# Patient Record
Sex: Female | Born: 1960 | Race: Black or African American | Hispanic: No | Marital: Married | Smoking: Never smoker
Health system: Southern US, Community
[De-identification: ages and names within clinical notes are randomized; demographics above are authoritative.]

## PROBLEM LIST (undated history)

## (undated) DIAGNOSIS — I517 Cardiomegaly: Secondary | ICD-10-CM

## (undated) DIAGNOSIS — I38 Endocarditis, valve unspecified: Secondary | ICD-10-CM

## (undated) DIAGNOSIS — C189 Malignant neoplasm of colon, unspecified: Secondary | ICD-10-CM

## (undated) DIAGNOSIS — B9681 Helicobacter pylori [H. pylori] as the cause of diseases classified elsewhere: Secondary | ICD-10-CM

## (undated) DIAGNOSIS — M3500 Sicca syndrome, unspecified: Secondary | ICD-10-CM

## (undated) DIAGNOSIS — D509 Iron deficiency anemia, unspecified: Secondary | ICD-10-CM

## (undated) DIAGNOSIS — O039 Complete or unspecified spontaneous abortion without complication: Secondary | ICD-10-CM

## (undated) DIAGNOSIS — K635 Polyp of colon: Secondary | ICD-10-CM

## (undated) DIAGNOSIS — K2970 Gastritis, unspecified, without bleeding: Secondary | ICD-10-CM

## (undated) HISTORY — DX: Gastritis, unspecified, without bleeding: K29.70

## (undated) HISTORY — PX: HEMICOLECTOMY: SHX854

## (undated) HISTORY — PX: DILATION AND CURETTAGE OF UTERUS: SHX78

## (undated) HISTORY — PX: BREAST REDUCTION SURGERY: SHX8

## (undated) HISTORY — DX: Polyp of colon: K63.5

## (undated) HISTORY — DX: Iron deficiency anemia, unspecified: D50.9

## (undated) HISTORY — DX: Helicobacter pylori (H. pylori) as the cause of diseases classified elsewhere: B96.81

---

## 1991-10-11 HISTORY — PX: REDUCTION MAMMAPLASTY: SUR839

## 1997-12-16 ENCOUNTER — Inpatient Hospital Stay (HOSPITAL_COMMUNITY): Admission: AD | Admit: 1997-12-16 | Discharge: 1997-12-16 | Payer: Self-pay | Admitting: *Deleted

## 1998-07-06 ENCOUNTER — Encounter: Payer: Self-pay | Admitting: Emergency Medicine

## 1998-07-06 ENCOUNTER — Emergency Department (HOSPITAL_COMMUNITY): Admission: EM | Admit: 1998-07-06 | Discharge: 1998-07-06 | Payer: Self-pay | Admitting: Emergency Medicine

## 1999-02-08 ENCOUNTER — Other Ambulatory Visit: Admission: RE | Admit: 1999-02-08 | Discharge: 1999-02-08 | Payer: Self-pay | Admitting: *Deleted

## 1999-05-09 ENCOUNTER — Emergency Department (HOSPITAL_COMMUNITY): Admission: EM | Admit: 1999-05-09 | Discharge: 1999-05-09 | Payer: Self-pay | Admitting: Emergency Medicine

## 1999-05-19 ENCOUNTER — Encounter: Admission: RE | Admit: 1999-05-19 | Discharge: 1999-08-17 | Payer: Self-pay | Admitting: *Deleted

## 2000-02-29 ENCOUNTER — Other Ambulatory Visit: Admission: RE | Admit: 2000-02-29 | Discharge: 2000-02-29 | Payer: Self-pay | Admitting: *Deleted

## 2004-05-02 ENCOUNTER — Inpatient Hospital Stay (HOSPITAL_COMMUNITY): Admission: EM | Admit: 2004-05-02 | Discharge: 2004-05-05 | Payer: Self-pay | Admitting: Emergency Medicine

## 2004-05-05 ENCOUNTER — Encounter: Payer: Self-pay | Admitting: Cardiology

## 2004-05-19 ENCOUNTER — Encounter: Admission: RE | Admit: 2004-05-19 | Discharge: 2004-05-19 | Payer: Self-pay | Admitting: Internal Medicine

## 2007-07-21 ENCOUNTER — Emergency Department (HOSPITAL_COMMUNITY): Admission: EM | Admit: 2007-07-21 | Discharge: 2007-07-21 | Payer: Self-pay | Admitting: Emergency Medicine

## 2008-12-23 DIAGNOSIS — B9681 Helicobacter pylori [H. pylori] as the cause of diseases classified elsewhere: Secondary | ICD-10-CM

## 2008-12-23 DIAGNOSIS — K635 Polyp of colon: Secondary | ICD-10-CM

## 2008-12-23 HISTORY — DX: Helicobacter pylori (H. pylori) as the cause of diseases classified elsewhere: B96.81

## 2008-12-23 HISTORY — DX: Polyp of colon: K63.5

## 2010-09-29 ENCOUNTER — Ambulatory Visit: Payer: Self-pay | Admitting: Oncology

## 2010-10-26 ENCOUNTER — Ambulatory Visit (HOSPITAL_COMMUNITY)
Admission: RE | Admit: 2010-10-26 | Discharge: 2010-10-26 | Payer: Self-pay | Source: Home / Self Care | Attending: Oncology | Admitting: Oncology

## 2010-10-26 LAB — CBC WITH DIFFERENTIAL/PLATELET
BASO%: 0.5 % (ref 0.0–2.0)
Basophils Absolute: 0 10*3/uL (ref 0.0–0.1)
EOS%: 1.5 % (ref 0.0–7.0)
Eosinophils Absolute: 0.1 10*3/uL (ref 0.0–0.5)
HCT: 36.1 % (ref 34.8–46.6)
HGB: 11.5 g/dL — ABNORMAL LOW (ref 11.6–15.9)
LYMPH%: 32.3 % (ref 14.0–49.7)
MCH: 23.3 pg — ABNORMAL LOW (ref 25.1–34.0)
MCHC: 32 g/dL (ref 31.5–36.0)
MCV: 72.8 fL — ABNORMAL LOW (ref 79.5–101.0)
MONO#: 0.4 10*3/uL (ref 0.1–0.9)
MONO%: 8.1 % (ref 0.0–14.0)
NEUT#: 2.5 10*3/uL (ref 1.5–6.5)
NEUT%: 57.6 % (ref 38.4–76.8)
Platelets: 279 10*3/uL (ref 145–400)
RBC: 4.96 10*6/uL (ref 3.70–5.45)
RDW: 18.9 % — ABNORMAL HIGH (ref 11.2–14.5)
WBC: 4.4 10*3/uL (ref 3.9–10.3)
lymph#: 1.4 10*3/uL (ref 0.9–3.3)

## 2010-10-26 LAB — LACTATE DEHYDROGENASE: LDH: 175 U/L (ref 94–250)

## 2010-10-26 LAB — COMPREHENSIVE METABOLIC PANEL
ALT: 19 U/L (ref 0–35)
AST: 21 U/L (ref 0–37)
Albumin: 3.7 g/dL (ref 3.5–5.2)
Alkaline Phosphatase: 82 U/L (ref 39–117)
BUN: 13 mg/dL (ref 6–23)
CO2: 29 mEq/L (ref 19–32)
Calcium: 9.2 mg/dL (ref 8.4–10.5)
Chloride: 106 mEq/L (ref 96–112)
Creatinine, Ser: 0.98 mg/dL (ref 0.40–1.20)
Glucose, Bld: 103 mg/dL — ABNORMAL HIGH (ref 70–99)
Potassium: 3.2 mEq/L — ABNORMAL LOW (ref 3.5–5.3)
Sodium: 142 mEq/L (ref 135–145)
Total Bilirubin: 0.4 mg/dL (ref 0.3–1.2)
Total Protein: 7.2 g/dL (ref 6.0–8.3)

## 2010-10-26 LAB — CEA: CEA: 1.3 ng/mL (ref 0.0–5.0)

## 2010-10-28 LAB — IRON AND TIBC
%SAT: 15 % — ABNORMAL LOW (ref 20–55)
Iron: 53 ug/dL (ref 42–145)
TIBC: 359 ug/dL (ref 250–470)
UIBC: 306 ug/dL

## 2010-10-28 LAB — TRANSFERRIN RECEPTOR, SOLUABLE: Transferrin Receptor, Soluble: 25.6 nmol/L

## 2010-10-28 LAB — FERRITIN: Ferritin: 21 ng/mL (ref 10–291)

## 2010-11-30 ENCOUNTER — Emergency Department (HOSPITAL_COMMUNITY): Payer: Medicare Other

## 2010-11-30 ENCOUNTER — Emergency Department (HOSPITAL_COMMUNITY)
Admission: EM | Admit: 2010-11-30 | Discharge: 2010-11-30 | Disposition: A | Discharge status: Home / Self Care | Payer: Medicare Other | Attending: Emergency Medicine | Admitting: Emergency Medicine

## 2010-11-30 DIAGNOSIS — M79609 Pain in unspecified limb: Secondary | ICD-10-CM

## 2010-11-30 DIAGNOSIS — I10 Essential (primary) hypertension: Secondary | ICD-10-CM | POA: Insufficient documentation

## 2010-11-30 DIAGNOSIS — M3500 Sicca syndrome, unspecified: Secondary | ICD-10-CM | POA: Insufficient documentation

## 2010-11-30 DIAGNOSIS — M25569 Pain in unspecified knee: Secondary | ICD-10-CM | POA: Insufficient documentation

## 2011-02-25 NOTE — Discharge Summary (Signed)
NAMEJASSICA, [name]                           ACCOUNT NO.:  [id number]   MEDICAL RECORD NO.:  [id number]                   PATIENT TYPE:  INP   LOCATION:  3704                                 FACILITY:  [facility]   PHYSICIAN:  Madaline Guthrie, M.D.                 DATE OF BIRTH:  10-Apr-1961   DATE OF ADMISSION:  05/02/2004  DATE OF DISCHARGE:  05/05/2004                                 DISCHARGE SUMMARY   CHIEF COMPLAINT:  Chest pain.   CONTINUITY DOCTOR:  Dr. Woodroe Chen, phone number [phone].   CONSULTANTS:  None.   DISCHARGE DIAGNOSES:  1. Iron-deficiency anemia.  2. Hypertension.  3. Hypokalemia.   DISCHARGE MEDICATIONS:  1. Ferrous sulfate 220/44ml liquid 8 milliliters three times per day.  2. Hydrochlorothiazide 25 mg one pill each day.  3. Protonix 40 mg, one pill each day p.o.   The patient is instructed to call Dr. Melvyn Novas at [phone] to have a follow up  appointment in two weeks.  The patient will need to have lab work in order  to have her potassium checked in two weeks, and she has an appointment for  May 19, 2004, at 10 a.m. at the outpatient clinic at [facility]  for anemia and HTN.  The patient was also advised to see OB/GYN for workup  for possible menorrhagia.  The patient declined for medical staff to make  and appointment, and said that she would find an OB/GYN on her own   PROCEDURES PERFORMED:  On May 02, 2004, EKG, 78 beats per minute, normal  sinus rhythm, axis 1, PR interval of 0.12 seconds, QRS interval of 0.06  seconds, QTC of 0.49 seconds.  Poor R wave progression in aVF, V2 and V3.  No signs of a ST-elevation myocardial infarction, no hypertrophy, not bundle  branch block.  The EKG was repeated on May 04, 2004, which showed that the  long QTC had resolved.   On May 02, 2004, chest x-ray showed some cardiomegaly, particularly on the  left side of the chest, but no signs of aortic dissection or other acute  cardiopulmonary  process.   The patient had a 2-D echocardiogram on July 27 which showed overall left  ventricular systolic function was normal.  Mitral valve showed normal  leaflet excursion as well as normal function.  The aortic valve thickness  with mildly increased.  Otherwise, the valves appeared to be grossly normal.  Left ventricular ejection fraction was estimated at being between 55 and  65%.  There was no diagnostic evidence of left ventricular regional wall  motion abnormalities. Left ventricular diastolic function parameters were  normal.   CONSULTATIONS:  None.   BRIEF ADMITTING HISTORY AND PHYSICAL:  The patient is a 50 year old female  who presented to the ER with leg and chest pain.  The chest pain was most  consistent with non-cardiac or atypical chest pain.  The leg pain was  characterized as crampy and worsened with movement, although it was relieved  by massage.  The patient stated that she was on the 5th day of her menstrual  cycle, which she stated has been of normal length and intensity of flow, and  noted fatigue, pallor, and dyspnea that had begun the day of admission.  The  patient also reported craving ice.  The patient's admission labs included a  white blood cell count of 4.5, hemoglobin of 3.1, hematocrit of 10.9, and  platelet count of 501.  Venous blood gas with a pH of 7.35, a pCO2 of 44.6,  a pO2 of 24.6.  ANC of 2.0, mean corpuscular volume of 48.  Red target cells  were noted on the patient's red blood cell smear.  She had an RDW of 23.2.   PROBLEM LIST:  1. Iron-deficiency anemia, probably secondary to the patient's menstrual     cycles.  The patient was given three units of packed red blood cells in     the ER, and upon initial arrival on the floor, this was successful in     increasing her hemoglobin up to 6.0.  The patient then received two     additional units of packed red blood cells.  By her third day in the     hospital, her hemoglobin had come up to 8.1.   Upon discharge, her     hemoglobin was 8.9.  As it had been stable, this was reassuring that the     patient was no longer in acute danger due to her anemia.  The patient was     checked after her period ceased and found to be guaiac negative, and she     had no reports of bleeding, no signs of tamponade, no hematuria, and     given her history of recent menstrual cycle, it was deemed this was by     far the most likely cause of her anemia.  As far as her anemia workup was     concerned, she had a ferritin level of 1 which is extremely low.     Fortunately, also the patient's risk of bleeding was relatively low with     a prothrombin time of 14.8, INR of 1.2, and a PTT of 34.  After the     patient's blood transfusions had ceased and her hemoglobin had more or     less stabilized, she was started on a liquid iron preparation.  This was     done because she has a strong aversion to large pills, but she seemed to     do well with the liquid.  The patient was advised to follow up with     OB/GYN for probably rather heavy periods, and the patient was advised to     consume a variety of iron-enriched foods and provided with information     from the nutritional department.  2. Hypertension.  The patient had a long running history of hypertension     prior to admission, which she stated improved over the last couple of     years because she had lost 88 pounds since approximately two years ago.     However, the patient's blood pressures were consistently elevated with     systolics usually running between 150 and 170/180.  After the patient's     anemia had resolved somewhat and her hemoglobin had stabilized, she was     started  on hydrochlorothiazide 25 mg once a day in a liquid form which     she tolerated well.  She had been on hydrochlorothiazide on an outpatient     basis some years prior to admission, along with Accupril 40 mg.  Although    the patient's potassium dropped considerably after  she started     hydrochlorothiazide, it was felt that given her past record, it was worth     continuing.  She was advised to eat a potassium enriched diet after being     repleted with KCL.  Unfortunately, the patient's blood pressure did not     go to normally-acceptable limits after she started the     hydrochlorothiazide, and her blood pressure medication will need to be     further titrated on an outpatient basis.  3. Hypokalemia.  As noted before on her third day of admission, the     patient's potassium dropped from 3.6 to 3.0 probably secondary to her     starting hydrochlorothiazide.  The patient's potassium was repleted, and     her potassium level was checked prior to discharge, at which time it was     3.9.  Again, the patient was advised to eat a variety of potassium     enriched foods upon discharge, and this issue will need to be further     followed on an outpatient basis.   DISCHARGE LABORATORIES:  The patient had discharge sodium of 136, potassium  3.9, chloride 105, bicarbonate 27, glucose 99.  BUN 5, creatinine 1.0,  calcium 8.4.  White blood cell count 5.9, hemoglobin 8.9, hematocrit 27.3,  platelet count 354,000.  A guaiac-negative occult blood check after her  period resolved.  A TSH level of 1.080.  PT of 14.8, INR 1.2, PTT 34,  ferritin of 1.  Iron of 39, total iron-binding capacity 420, percent  saturation 9%.  She ruled out negative three times for cardiac enzymes.  Reticulocyte count of 0.1, bright red blood cells 3.17, absolute  reticulocytes 3.2.  Serum pregnancy test was negative.  Technologist noted  on the patient's pre-transfusion blood, marked leukocytosis, teardrop cells,  target cells, schistocytes, ___________ at that point.      Laurell Roof, M.D.                      Madaline Guthrie, M.D.    LC/MEDQ  D:  05/05/2004  T:  05/05/2004  Job:  [id number]

## 2011-04-17 ENCOUNTER — Emergency Department (HOSPITAL_COMMUNITY): Payer: Medicare Other

## 2011-04-17 ENCOUNTER — Emergency Department (HOSPITAL_COMMUNITY)
Admission: EM | Admit: 2011-04-17 | Discharge: 2011-04-17 | Disposition: A | Discharge status: Home / Self Care | Payer: Medicare Other | Attending: Emergency Medicine | Admitting: Emergency Medicine

## 2011-04-17 DIAGNOSIS — R071 Chest pain on breathing: Secondary | ICD-10-CM | POA: Insufficient documentation

## 2011-04-17 DIAGNOSIS — Z79899 Other long term (current) drug therapy: Secondary | ICD-10-CM | POA: Insufficient documentation

## 2011-04-17 DIAGNOSIS — M3500 Sicca syndrome, unspecified: Secondary | ICD-10-CM | POA: Insufficient documentation

## 2011-04-17 DIAGNOSIS — R0602 Shortness of breath: Secondary | ICD-10-CM | POA: Insufficient documentation

## 2011-04-17 LAB — CK TOTAL AND CKMB (NOT AT ARMC)
CK, MB: 2.6 ng/mL (ref 0.3–4.0)
CK, MB: 2.5 ng/mL (ref 0.3–4.0)
Relative Index: 0.7 (ref 0.0–2.5)
Total CK: 421 U/L — ABNORMAL HIGH (ref 7–177)
Total CK: 355 U/L — ABNORMAL HIGH (ref 7–177)

## 2011-04-17 LAB — CBC
HCT: 36.8 % (ref 36.0–46.0)
Hemoglobin: 12.5 g/dL (ref 12.0–15.0)
MCH: 25.1 pg — ABNORMAL LOW (ref 26.0–34.0)
MCHC: 34 g/dL (ref 30.0–36.0)
RBC: 4.98 MIL/uL (ref 3.87–5.11)

## 2011-04-17 LAB — DIFFERENTIAL
Lymphocytes Relative: 41 % (ref 12–46)
Lymphs Abs: 2.3 10*3/uL (ref 0.7–4.0)
Monocytes Absolute: 0.5 10*3/uL (ref 0.1–1.0)
Monocytes Relative: 9 % (ref 3–12)
Neutro Abs: 2.6 10*3/uL (ref 1.7–7.7)
Neutrophils Relative %: 48 % (ref 43–77)

## 2011-04-17 LAB — BASIC METABOLIC PANEL
CO2: 26 mEq/L (ref 19–32)
Calcium: 9.4 mg/dL (ref 8.4–10.5)
GFR calc non Af Amer: >60 mL/min (ref >60)
Glucose, Bld: 87 mg/dL (ref 70–99)
Potassium: 3.7 mEq/L (ref 3.5–5.1)
Sodium: 139 mEq/L (ref 135–145)

## 2011-04-17 LAB — TROPONIN I: Troponin I: <0.3 ng/mL (ref <0.30)

## 2011-08-23 ENCOUNTER — Emergency Department (HOSPITAL_COMMUNITY): Payer: Medicare Other

## 2011-08-23 ENCOUNTER — Emergency Department (HOSPITAL_COMMUNITY)
Admission: EM | Admit: 2011-08-23 | Discharge: 2011-08-23 | Disposition: A | Discharge status: Home / Self Care | Payer: Medicare Other | Attending: Emergency Medicine | Admitting: Emergency Medicine

## 2011-08-23 ENCOUNTER — Other Ambulatory Visit: Payer: Self-pay

## 2011-08-23 DIAGNOSIS — R0789 Other chest pain: Secondary | ICD-10-CM | POA: Insufficient documentation

## 2011-08-23 DIAGNOSIS — M25519 Pain in unspecified shoulder: Secondary | ICD-10-CM | POA: Insufficient documentation

## 2011-08-23 HISTORY — DX: Malignant neoplasm of colon, unspecified: C18.9

## 2011-08-23 HISTORY — DX: Sjogren syndrome, unspecified: M35.00

## 2011-08-23 LAB — TROPONIN I: Troponin I: <0.3 ng/mL (ref <0.30)

## 2011-08-23 LAB — CBC
MCH: 25.5 pg — ABNORMAL LOW (ref 26.0–34.0)
MCV: 75.1 fL — ABNORMAL LOW (ref 78.0–100.0)
Platelets: 288 10*3/uL (ref 150–400)
RBC: 5.38 MIL/uL — ABNORMAL HIGH (ref 3.87–5.11)
RDW: 16.6 % — ABNORMAL HIGH (ref 11.5–15.5)

## 2011-08-23 LAB — DIFFERENTIAL
Basophils Absolute: 0 10*3/uL (ref 0.0–0.1)
Basophils Relative: 0 % (ref 0–1)
Eosinophils Absolute: 0.1 10*3/uL (ref 0.0–0.7)
Eosinophils Relative: 1 % (ref 0–5)
Neutrophils Relative %: 42 % — ABNORMAL LOW (ref 43–77)

## 2011-08-23 MED ORDER — IOHEXOL 300 MG/ML  SOLN
100.0000 mL | Freq: Once | INTRAMUSCULAR | Status: AC | PRN
  Administered 2011-08-23: 100 mL via INTRAVENOUS

## 2011-08-23 MED ORDER — MORPHINE SULFATE 4 MG/ML IJ SOLN
4.0000 mg | Freq: Once | INTRAMUSCULAR | Status: AC
  Administered 2011-08-23: 4 mg via INTRAVENOUS
  Filled 2011-08-23: qty 1

## 2011-08-23 MED ORDER — OMEPRAZOLE 20 MG PO CPDR: DELAYED_RELEASE_CAPSULE | ORAL | Status: DC

## 2011-08-23 MED ORDER — MORPHINE SULFATE 4 MG/ML IJ SOLN
2.0000 mg | Freq: Once | INTRAMUSCULAR | Status: AC
  Administered 2011-08-23: 2 mg via INTRAVENOUS
  Filled 2011-08-23: qty 1

## 2011-08-23 MED ORDER — SODIUM CHLORIDE 0.9 % IV SOLN
Freq: Once | INTRAVENOUS | Status: AC
  Administered 2011-08-23: 13:00:00 via INTRAVENOUS

## 2011-08-23 MED ORDER — ASPIRIN 81 MG PO CHEW
324.0000 mg | CHEWABLE_TABLET | Freq: Once | ORAL | Status: AC
  Administered 2011-08-23: 324 mg via ORAL
  Filled 2011-08-23: qty 4

## 2011-08-23 NOTE — ED Notes (Signed)
The pts pain is returning she is reguesting pain med given.  She is also waiting to be scanned c-t.  Her pain is rt sided chest that increases with the movement of her rt arm.  She describes as a heaviness

## 2011-08-23 NOTE — ED Provider Notes (Signed)
Medical screening examination/treatment/procedure(s) were performed by non-physician practitioner and as supervising physician I was immediately available for consultation/collaboration  Harrold Donath R. Rubin Payor, MD 08/23/11 [phone]

## 2011-08-23 NOTE — ED Notes (Signed)
Pt back from c-t her pain is not as severe as it  Once was but she still has the pain

## 2011-08-23 NOTE — ED Notes (Signed)
The pt has gone to c-t 10 minutes ago

## 2011-08-23 NOTE — ED Provider Notes (Signed)
Medical screening examination/treatment/procedure(s) were performed by non-physician practitioner and as supervising physician I was immediately available for consultation/collaboration  Rye Decoste R. Alease Fait, MD 08/23/11 1852 

## 2011-08-23 NOTE — ED Notes (Signed)
Pt c/o constant CP w/ pain radiating down right arm. Also c/o SOB with exertion

## 2011-08-23 NOTE — ED Provider Notes (Signed)
Patient still having pain that has changed locations. Cardiac and PE workup negative. Feel we can send the patient home to follow up with Dr. Nehemiah Settle. Recommended Prilosec and close follow up.  Rodena Medin, PA 08/23/11 [phone]

## 2011-08-23 NOTE — ED Provider Notes (Signed)
HPI Patient presents to emergency room with complaint of chest pain. Reports of the chest pain started last night. Radiates to her right shoulder. Reports it is worse with inspiration.  Patient has a past medical history significant for colon cancer. Patient denies any coughing. Patient denies that the pain is worse with exertion. Denies any exertional dyspnea. Reports that the pain has been constant in nature since last night. Patient denies ever having had a stress test. Patient states that she had some chest pain about 5 years ago that was diagnosis costochondritis. Patient denies any fevers. Patient denies any nausea or vomiting or abdominal pain. Patient reports the pain radiates into her right shoulder. Pain is located substernally in nature.    Screening exam completed. Initial exam performed.   Demetrius Charity, PA 08/23/11 1226   PMH  Colon cancer   ROS No abdominal pain, exertional dyspnea, exertional chest pain. No nausea or vomiting   PE: VS reviewed.  Patient Vitals for the past 24 hrs:  BP Temp Temp src Pulse Resp SpO2  08/23/11 1435 113/72 mmHg 97 F (36.1 C) Oral 55  15  99 %  08/23/11 1041 145/83 mmHg 98 F (36.7 C) Oral 69  19  -   Normocephalic atraumatic. PEERL. RRR. CTA. No pedal edema. Tenderness to palpation to the right side of her chest. Normal ROM.  Abdomen soft/nontender.    ED course:  Patient seen and evaluated.  VSS reviewed. . Nursing notes reviewed. Discussed with attending physician, advised CT angio to r/o PE due to patients PMH signficant for colon cancer. Cannot PERC patient because she is over 50. Lab results are WNL. EKG reviewed with Dr. Rubin Payor. Initial testing ordered. Will monitor the patient closely. They agree with the treatment plan and diagnosis.    Patient seen and re-evaluated. Resting comfortably. VSS stable. NAD. Patient notified of testing results. Stated agreement and understanding. Patient stated understanding to treatment plan  and diagnosis.    Date: 08/23/2011, 10:36 am  Rate: 81  Rhythm: normal sinus rhythm  QRS Axis: normal  Intervals: normal  ST/T Wave abnormalities: nonspecific T wave changes and in anterior leads  Conduction Disutrbances:none  Narrative Interpretation:   Old EKG Reviewed: unchanged    3:25 PM Patient care taken over by Shawn Stall, PA-C at 15:25 pm.    MDM: Chest pain   Demetrius Charity, PA 08/23/11 1526  Demetrius Charity, PA 08/23/11 1546

## 2011-11-02 ENCOUNTER — Telehealth: Payer: Self-pay | Admitting: Oncology

## 2011-11-02 ENCOUNTER — Other Ambulatory Visit: Payer: Self-pay | Admitting: Rheumatology

## 2011-11-02 DIAGNOSIS — Z1231 Encounter for screening mammogram for malignant neoplasm of breast: Secondary | ICD-10-CM

## 2011-11-02 NOTE — Telephone Encounter (Signed)
pt had called and needed to make her 2013 appt,per mosaiq pt had missed 02/2011 appt,appt made for 12/29/11  aom

## 2011-11-24 ENCOUNTER — Ambulatory Visit
Admission: RE | Admit: 2011-11-24 | Discharge: 2011-11-24 | Disposition: A | Discharge status: Home / Self Care | Payer: Medicare Other | Source: Ambulatory Visit | Attending: Rheumatology | Admitting: Rheumatology

## 2011-11-24 DIAGNOSIS — Z1231 Encounter for screening mammogram for malignant neoplasm of breast: Secondary | ICD-10-CM

## 2011-12-29 ENCOUNTER — Other Ambulatory Visit (HOSPITAL_BASED_OUTPATIENT_CLINIC_OR_DEPARTMENT_OTHER): Payer: Medicare Other | Admitting: Lab

## 2011-12-29 ENCOUNTER — Telehealth: Payer: Self-pay | Admitting: Oncology

## 2011-12-29 ENCOUNTER — Encounter: Payer: Self-pay | Admitting: Internal Medicine

## 2011-12-29 ENCOUNTER — Ambulatory Visit (HOSPITAL_BASED_OUTPATIENT_CLINIC_OR_DEPARTMENT_OTHER): Payer: Medicare Other | Admitting: Oncology

## 2011-12-29 ENCOUNTER — Encounter: Payer: Self-pay | Admitting: Oncology

## 2011-12-29 VITALS — BP 139/95 | HR 68 | Temp 97.9°F | Ht 69.0 in | Wt 205.8 lb

## 2011-12-29 DIAGNOSIS — C180 Malignant neoplasm of cecum: Secondary | ICD-10-CM

## 2011-12-29 DIAGNOSIS — G589 Mononeuropathy, unspecified: Secondary | ICD-10-CM

## 2011-12-29 DIAGNOSIS — R1013 Epigastric pain: Secondary | ICD-10-CM | POA: Insufficient documentation

## 2011-12-29 DIAGNOSIS — Z85038 Personal history of other malignant neoplasm of large intestine: Secondary | ICD-10-CM | POA: Insufficient documentation

## 2011-12-29 DIAGNOSIS — R109 Unspecified abdominal pain: Secondary | ICD-10-CM

## 2011-12-29 LAB — CBC WITH DIFFERENTIAL/PLATELET
Basophils Absolute: 0 10*3/uL (ref 0.0–0.1)
EOS%: 2.2 % (ref 0.0–7.0)
LYMPH%: 34.4 % (ref 14.0–49.7)
MCH: 25.2 pg (ref 25.1–34.0)
MCV: 75.9 fL — ABNORMAL LOW (ref 79.5–101.0)
MONO%: 6.9 % (ref 0.0–14.0)
RBC: 4.97 10*6/uL (ref 3.70–5.45)
RDW: 16.3 % — ABNORMAL HIGH (ref 11.2–14.5)
nRBC: 0 % (ref 0–0)

## 2011-12-29 NOTE — Progress Notes (Signed)
CC:   [name]. Polite, M.D. Sara Nida, MD  PROBLEM LIST: 1. Moderately differentiated adenocarcinoma of the colon, ileocecal     valve diagnosed and treated when the patient was living in [place],     [place].  Her oncologist was Dr. Minta Casey.  I believe     she presented with iron-deficiency anemia.  Biopsy on colonoscopy     was obtained on 03/27/2007.  The patient underwent lap hand-     assisted right hemicolectomy on 04/20/2007.  Tumor stage was T3 N1     with 1 out of 30 lymph nodes positive, IIIB.  She underwent     adjuvant chemotherapy with FOLFOX for 12 cycles from May 14, 2008, through November 14, 2008.  The patient remains in a     state of complete remission.  Family history for colon cancer was     negative.  [name] was carried out in March     2011. 2. 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  The patient has seen Dr. Levert Casey for     consultation. 3. Sjogren syndrome diagnosed in October 2010, currently seeing Dr.     Azzie Casey. 4. Hypertension. 5. History of iron-deficiency anemia. 6. Thyroid enlargement noted on CT angiogram of chest carried out on     08/23/2011. 7. Cardiomegaly noted on CT angiogram of chest 08/23/2011. 8. Epigastric pain with onset January 2013.  MEDICATIONS: 1. Norvasc 10 mg daily. 2. Vitamin D 1000 units daily. 3. Ferrous sulfate 325 mg twice daily. 4. CellCept 500 mg twice daily. 5. Ultram 50 mg in the morning and mid day and then 100 mg in the     evening. 6. Vitamin B12 1000 mcg daily.  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 The patient is accompanied by her husband, [name].  The patient was seen for her initial and only visit by me on 10/26/2010.  The patient was supposed to come back in 4 months, but states that nobody called  her. She called for an appointment for followup.  Her main complaint today seems to be epigastric pain with onset in January 2013.  The pain is described as dull, occurs following meals, but not every meal. Apparently she is having the pain everyday.  She feels this has curtailed her intake and she has lost approximately 5 or 6 pounds over the past year.  The patient continues to have peripheral sensory neuropathy, for which she takes Ultram.  Apparently she had seen Dr. Terrace Casey for evaluation in the past.  The patient tells me that she had gone to the emergency room because of sharp chest pain going into her right breast.  She did have CT angiogram of the chest which did not show pulmonary embolism, but did note cardiomegaly and enlargement of the thyroid gland.  The patient is on iron and has some tendency toward constipation.  No obvious blood in the stools.  Stools are dark.  In general she seems to be doing fairly well.  She did ask me to complete disability forms for her.  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  PHYSICAL EXAMINATION:  General:  The patient looks well.  Vital Signs: Weight is 205.8 pounds, as compared with 211.2 pounds on October 26, 2010.  Height 5 feet 9 inches, body surface area 2.13 sq m.  Blood pressure 139/95.  Other vital signs are normal.  HEENT:  There is no scleral icterus.  Mouth and pharynx are benign.  There is no peripheral adenopathy palpable.  Thyroid gland may be slightly enlarged.  I had noted this back on October 26, 2010.  Heart/Lungs:  Normal.  Breasts: Not examined.  Abdomen:  Somewhat obese, nontender with no organomegaly or masses palpable.  Her midline vertical incision is well healed. There is no tenderness or pain today.  No axillary or inguinal adenopathy.  Extremities:  No peripheral edema.  Neurologic Exam: Grossly normal.  LABORATORY DATA:  Today white count 4.1, ANC 2.3, hemoglobin 12.5, hematocrit  37.7, platelets 261,000.  MCV was 75.9, MCH 25.2.  MCV is low.  Differential was normal.  Chemistries, iron studies, and CEA today are pending.  Chemistries from 10/26/2010 notable for a potassium of 3.2, glucose 103.  The patient had been on high doses of prednisone at that time.  Followup chemistries on 07/08 showed a potassium of 3.7, BUN 12 creatinine 0.71, and glucose 87.  Ferritin on 10/26/2010 was 21. Iron saturation 15%.  The patient was started on iron at that time and has remained on ferrous sulfate twice a day.  CEA was 1.3 on 10/26/2010.  IMAGING STUDIES: 1. Chest x-ray, 2 view, on 08/23/2011 showed stable mild cardiomegaly     with no active lung disease. 2. CT angiogram of the chest with IV contrast showed no evidence for     acute pulmonary embolism.  There were no acute findings in the     chest.  Cardiomegaly and thyromegaly were noted. 3. Digital screening mammogram on 11/24/2011 was negative.  IMPRESSION AND PLAN:  The patient seems to be doing fairly well now almost 5 years from the time of diagnosis of her colon cancer without evidence of recurrence.  Because of her abdominal pain which is located in the epigastrium, I will make a referral to 1 of the gastroenterologists for evaluation.  The patient may need endoscopy.  I think the likelihood of metastatic colon cancer is quite low at this time.  The patient asked [place] to fill out disability forms.  I deferred this to Dr. Nehemiah Casey.  The patient feels she is disabled because of her neuropathy secondary probably to oxaliplatin.  We will have [name] return in 6 months, at which time we will check CBC, chemistries, CEA, and iron studies.    ______________________________ Samul Dada, M.D. DSM/MEDQ  D:  12/29/2011  T:  12/29/2011  Job:  [id number]

## 2011-12-29 NOTE — Telephone Encounter (Signed)
appts made and printed for pt,also ref made for dr brodi    aom

## 2011-12-29 NOTE — Progress Notes (Signed)
This office note has been dictated.  #[id number]

## 2012-01-02 LAB — COMPREHENSIVE METABOLIC PANEL
Alkaline Phosphatase: 113 U/L (ref 39–117)
Creatinine, Ser: 0.81 mg/dL (ref 0.50–1.10)
Glucose, Bld: 74 mg/dL (ref 70–99)
Sodium: 142 mEq/L (ref 135–145)
Total Bilirubin: 0.5 mg/dL (ref 0.3–1.2)
Total Protein: 6.9 g/dL (ref 6.0–8.3)

## 2012-01-02 LAB — TRANSFERRIN RECEPTOR, SOLUABLE: Transferrin Receptor, Soluble: 1.56 mg/L (ref 0.76–1.76)

## 2012-01-02 LAB — IRON AND TIBC
%SAT: 20 % (ref 20–55)
Iron: 62 ug/dL (ref 42–145)
TIBC: 304 ug/dL (ref 250–470)

## 2012-02-17 ENCOUNTER — Telehealth: Payer: Self-pay

## 2012-02-17 ENCOUNTER — Telehealth: Payer: Self-pay | Admitting: Internal Medicine

## 2012-02-17 NOTE — Telephone Encounter (Signed)
Left vmail on Dr Murinson's nurse vmail to c-b w/info

## 2012-02-17 NOTE — Telephone Encounter (Signed)
Dr Regino Schultze office called asking for any prior GI doctors that pt had seen. I let them know our records show Dr Minta Balsam in [place], [place] who is an oncologist.

## 2012-02-21 ENCOUNTER — Encounter: Payer: Self-pay | Admitting: *Deleted

## 2012-02-21 ENCOUNTER — Telehealth: Payer: Self-pay | Admitting: Internal Medicine

## 2012-02-21 NOTE — Telephone Encounter (Signed)
Left msg on Dr Murinson's nurse's vmail again.

## 2012-02-29 ENCOUNTER — Ambulatory Visit: Payer: Medicare Other | Admitting: Internal Medicine

## 2012-03-26 ENCOUNTER — Telehealth: Payer: Self-pay

## 2012-03-26 NOTE — Telephone Encounter (Signed)
Pt called stating Dr Arline Asp told her the CT of 08/23/11 showed cardiomegaly. She is requesting a cardiology referral. I stated I would check w/Dr Murinson if he would do this or if he would want her PCP, Dr Nehemiah Settle, to do this.

## 2012-03-27 ENCOUNTER — Telehealth: Payer: Self-pay

## 2012-03-27 NOTE — Telephone Encounter (Signed)
S/w pt that DSM said her referral for cardiologist should go through her PCP, Dr Nehemiah Settle. Pt expressed understanding.

## 2012-06-27 ENCOUNTER — Telehealth: Payer: Self-pay

## 2012-06-27 ENCOUNTER — Other Ambulatory Visit: Payer: Self-pay

## 2012-06-27 NOTE — Telephone Encounter (Signed)
S/w receptionist that pt had an appt for 5/22 with Dr Juanda Chance. The patient cancelled on 5/21 stating she would call to reschedule. The pt has not called to reschedule.

## 2012-06-29 ENCOUNTER — Telehealth: Payer: Self-pay | Admitting: Oncology

## 2012-06-29 NOTE — Telephone Encounter (Signed)
S/w the pt and she is aware of her r/s sept appt to oct.

## 2012-07-03 ENCOUNTER — Ambulatory Visit: Payer: Medicare Other | Admitting: Oncology

## 2012-07-03 ENCOUNTER — Other Ambulatory Visit: Payer: Medicare Other | Admitting: Lab

## 2012-07-10 ENCOUNTER — Other Ambulatory Visit (HOSPITAL_BASED_OUTPATIENT_CLINIC_OR_DEPARTMENT_OTHER): Payer: Medicare Other | Admitting: Lab

## 2012-07-10 ENCOUNTER — Encounter: Payer: Self-pay | Admitting: Family

## 2012-07-10 ENCOUNTER — Ambulatory Visit (HOSPITAL_BASED_OUTPATIENT_CLINIC_OR_DEPARTMENT_OTHER): Payer: Medicare Other | Admitting: Family

## 2012-07-10 ENCOUNTER — Telehealth: Payer: Self-pay | Admitting: Oncology

## 2012-07-10 VITALS — BP 143/88 | HR 62 | Temp 99.3°F | Resp 20 | Ht 69.0 in | Wt 197.7 lb

## 2012-07-10 DIAGNOSIS — Z85038 Personal history of other malignant neoplasm of large intestine: Secondary | ICD-10-CM

## 2012-07-10 DIAGNOSIS — C180 Malignant neoplasm of cecum: Secondary | ICD-10-CM

## 2012-07-10 LAB — COMPREHENSIVE METABOLIC PANEL (CC13)
Albumin: 3.5 g/dL (ref 3.5–5.0)
Alkaline Phosphatase: 141 U/L (ref 40–150)
BUN: 15 mg/dL (ref 7.0–26.0)
Calcium: 9.6 mg/dL (ref 8.4–10.4)
Chloride: 104 mEq/L (ref 98–107)
Glucose: 105 mg/dl — ABNORMAL HIGH (ref 70–99)
Potassium: 3.8 mEq/L (ref 3.5–5.1)
Sodium: 139 mEq/L (ref 136–145)
Total Protein: 7.1 g/dL (ref 6.4–8.3)

## 2012-07-10 LAB — CBC WITH DIFFERENTIAL/PLATELET
Basophils Absolute: 0 10*3/uL (ref 0.0–0.1)
Eosinophils Absolute: 0.1 10*3/uL (ref 0.0–0.5)
HCT: 37.2 % (ref 34.8–46.6)
HGB: 12.3 g/dL (ref 11.6–15.9)
MONO#: 0.5 10*3/uL (ref 0.1–0.9)
NEUT#: 2.6 10*3/uL (ref 1.5–6.5)
NEUT%: 56.5 % (ref 38.4–76.8)
RDW: 16.6 % — ABNORMAL HIGH (ref 11.2–14.5)
WBC: 4.5 10*3/uL (ref 3.9–10.3)
lymph#: 1.4 10*3/uL (ref 0.9–3.3)

## 2012-07-10 NOTE — Patient Instructions (Addendum)
Make appointment to see Neurologist if headaches persists.

## 2012-07-10 NOTE — Progress Notes (Signed)
Patient ID: [name], female   DOB: 09-Feb-1961, 51 y.o.   MRN: [id number] CSN: [id number]  CC: [name], M.D.  Lurena Nida, MD   Problem List: [name] is a 51 y.o. African-American female with a problem list consisting of:  1. Moderately differentiated adenocarcinoma of the colon, ileocecal valve diagnosed and treated when the patient was living in [place], [place]. Her oncologist was Dr. Minta Balsam. She presented with iron-deficiency anemia. Biopsy on colonoscopy was obtained on 03/27/2007. The patient underwent lap hand-assisted right hemicolectomy on 04/20/2007. Tumor stage was T3 N1 with 1 out of 30 lymph nodes positive, IIIB. She underwent adjuvant chemotherapy with FOLFOX for 12 cycles from May 15, 2007, through November 15, 2007. The patient remains in a state of complete remission. Family history for colon cancer was negative. [name] was carried out in March 2011 and is scheduled to be repeated again in 2014.  2. 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 The patient has seen Dr. Levert Feinstein for consultation.  3. Sjogren syndrome diagnosed in October 2010, currently seeing Dr. Azzie Roup.  4. Hypertension.  5. History of iron-deficiency anemia.  6. Thyroid enlargement noted on CT angiogram of chest carried out on 08/23/2011.  7. Cardiomegaly noted on CT angiogram of chest 08/23/2011.  8. Epigastric pain with onset January 2013.  Dr. 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 We last saw the patient on 12/29/2011.  The patient is without major complaint today, but does state that she has had a headache located in her occipital area for the last 3 days.  [name] sees her Neurologist, Dr. Pablo Lawrence regularly and stated she had a nerve block on 06/26/2012.  She stated that her headaches are  relieved when she rests and takes deep breaths. The patient continues to have peripheral sensory neuropathy (bilateral hands and feet), for which she takes Ultram. She had seen Dr. Terrace Arabia for evaluation of the neuropathy in the past.  [name] denies any other symptomatology today including abdominal pain, chills, unusual bleeding, fever, other pain, night sweats, N/V/D and constipation. The patient is on oral iron and has tendency toward constipation in the past. In general she seems to be doing fairly well, she remains active and states that she runs a half-mile daily.   Past Medical History: Past Medical History  Diagnosis Date  . Sjoegren syndrome   . Colon cancer     finished chemo feb 2009  . Iron deficiency anemia   . Helicobacter pylori gastritis 12/23/08  . Hyperplastic colon polyp 12/23/08    Surgical History: Past Surgical History  Procedure Date  . Hemicolectomy     2008  . Dilation and curettage of uterus     x 7  . Breast reduction surgery     Current Medications: Current Outpatient Prescriptions  Medication Sig Dispense Refill  . amLODipine (NORVASC) 10 MG tablet Take 10 mg by mouth daily.        . cholecalciferol (VITAMIN D) 1000 UNITS tablet Take 1,000 Units by mouth daily.        . ferrous sulfate 325 (65 FE) MG tablet Take 325 mg by mouth 2 (two) times daily.        . mycophenolate (CELLCEPT) 500 MG tablet Take 500 mg by mouth 2 (two) times daily.        . traMADol (  ULTRAM) 50 MG tablet Take 50 mg by mouth 3 (three) times daily as needed. For pain. Maximum dose= 8 tablets per day       . vitamin B-12 (CYANOCOBALAMIN) 1000 MCG tablet Take 1,000 mcg by mouth daily.          Allergies: No Known Allergies  Family History: Family History  Problem Relation Age of Onset  . Anemia Mother   . Diabetes Mother   . Kidney disease Mother   . Colon cancer Neg Hx     Social History: History  Substance Use Topics  . Smoking status: Never Smoker   . Smokeless tobacco:  Never Used  . Alcohol Use: No    Review of Systems: 10 Point review of systems was completed and is negative except as noted above.   Physical Exam:   Blood pressure 143/88, pulse 62, temperature 99.3 F (37.4 C), temperature source Oral, resp. rate 20, height 5\' 9"  (1.753 m), weight 197 lb 11.2 oz (89.676 kg).  General appearance: Alert, cooperative, well nourished, mild distress due to headache Head: Normocephalic, without obvious abnormality, atraumatic Eyes: Conjunctivae/corneas clear, PERRLA, EOMI Nose: Nares, septum and mucosa are normal, no drainage or sinus tenderness Neck: No adenopathy, supple, symmetrical, trachea midline, thyroid enlarged, no tenderness Resp: Clear to auscultation bilaterally Cardio: Regular rate and rhythm, S1, S2 normal, no murmur, click, rub or gallop GI: Soft, non-tender, distended, hypoactive bowel sounds, no organomegaly Extremities: Extremities normal, atraumatic, no cyanosis or edema Lymph nodes: Cervical, supraclavicular, and axillary nodes normal Neurologic: Grossly normal   Laboratory Data: Results for orders placed in visit on 07/10/12 (from the past 48 hour(s))  CBC WITH DIFFERENTIAL     Status: Abnormal   Collection Time   07/10/12 10:53 AM      Component Value Range Comment   WBC 4.5  3.9 - 10.3 10e3/uL    NEUT# 2.6  1.5 - 6.5 10e3/uL    HGB 12.3  11.6 - 15.9 g/dL    HCT 40.9  81.1 - 91.4 %    Platelets 237  145 - 400 10e3/uL    MCV 77.7 (*) 79.5 - 101.0 fL    MCH 25.7  25.1 - 34.0 pg    MCHC 33.0  31.5 - 36.0 g/dL    RBC 7.82  9.56 - 2.13 10e6/uL    RDW 16.6 (*) 11.2 - 14.5 %    lymph# 1.4  0.9 - 3.3 10e3/uL    MONO# 0.5  0.1 - 0.9 10e3/uL    Eosinophils Absolute 0.1  0.0 - 0.5 10e3/uL    Basophils Absolute 0.0  0.0 - 0.1 10e3/uL    NEUT% 56.5  38.4 - 76.8 %    LYMPH% 30.8  14.0 - 49.7 %    MONO% 10.1  0.0 - 14.0 %    EOS% 2.0  0.0 - 7.0 %    BASO% 0.6  0.0 - 2.0 %   LACTATE DEHYDROGENASE (CC13)     Status: Abnormal    Collection Time   07/10/12 10:53 AM      Component Value Range Comment   LDH 239 (*) 125 - 220 U/L   COMPREHENSIVE METABOLIC PANEL (CC13)     Status: Abnormal   Collection Time   07/10/12 10:53 AM      Component Value Range Comment   Sodium 139  136 - 145 mEq/L    Potassium 3.8  3.5 - 5.1 mEq/L    Chloride 104  98 - 107 mEq/L  CO2 27  22 - 29 mEq/L    Glucose 105 (*) 70 - 99 mg/dl    BUN 98.1  7.0 - 19.1 mg/dL    Creatinine 0.9  0.6 - 1.1 mg/dL    Total Bilirubin 4.78  0.20 - 1.20 mg/dL    Alkaline Phosphatase 141  40 - 150 U/L    AST 47 (*) 5 - 34 U/L    ALT 56 (*) 0 - 55 U/L    Total Protein 7.1  6.4 - 8.3 g/dL    Albumin 3.5  3.5 - 5.0 g/dL    Calcium 9.6  8.4 - 29.5 mg/dL     Imaging Studies: 1. Chest x-ray, 2 view, on 08/23/2011 showed stable mild cardiomegaly with no active lung disease.  2. CT angiogram of the chest with IV contrast showed no evidence for acute pulmonary embolism. There were no acute findings in the chest. Cardiomegaly and thyromegaly were noted.  3. Digital screening mammogram on 11/24/2011 was negative.    Impression/Plan: The patient seems to be doing fairly well now over 5 years from the time of diagnosis of her colon cancer without evidence of recurrence.  Her elevated LDH level was explained to her and we will repeat this laboratory in 1 month's time.  Barring any other laboratory abnormalities (iron studies remain outstanding),  [name] will return in 6 months, at which time we will check CBC, chemistries, CEA, iron studies and she will have an office visit with Dr. Arline Asp.   Larina Bras, NP-C 07/10/2012, 11:13 AM

## 2012-07-10 NOTE — Telephone Encounter (Signed)
s.w.pt and advise on NOV appt....sed

## 2012-07-10 NOTE — Telephone Encounter (Signed)
appts made and printed for pt aom °

## 2012-07-11 LAB — IRON AND TIBC
%SAT: 19 % — ABNORMAL LOW (ref 20–55)
Iron: 55 ug/dL (ref 42–145)

## 2012-07-11 LAB — FERRITIN: Ferritin: 47 ng/mL (ref 10–291)

## 2012-07-18 ENCOUNTER — Encounter: Payer: Self-pay | Admitting: Oncology

## 2012-08-13 ENCOUNTER — Other Ambulatory Visit: Payer: Medicare Other | Admitting: Lab

## 2012-08-13 ENCOUNTER — Ambulatory Visit: Payer: Medicare Other | Admitting: Family

## 2012-08-14 ENCOUNTER — Telehealth: Payer: Self-pay | Admitting: Oncology

## 2012-08-14 NOTE — Telephone Encounter (Signed)
Returned pt's call re r/s 11/4 lb appt. Not able to reach pt and lmonvm asking pt to call me back and let me know when she can come in. Pt was to have lbonly 11/4.

## 2012-08-16 ENCOUNTER — Other Ambulatory Visit (HOSPITAL_BASED_OUTPATIENT_CLINIC_OR_DEPARTMENT_OTHER): Payer: Medicare Other

## 2012-08-16 ENCOUNTER — Other Ambulatory Visit: Payer: Self-pay | Admitting: Oncology

## 2012-08-16 ENCOUNTER — Encounter: Payer: Self-pay | Admitting: Oncology

## 2012-08-16 DIAGNOSIS — Z85038 Personal history of other malignant neoplasm of large intestine: Secondary | ICD-10-CM

## 2012-08-16 NOTE — Progress Notes (Unsigned)
Repeat LDH on 08/16/2012 was 253 which remains slightly elevated. The patient was seen by [place] on 07/10/2012 and her LDH at that time was 239, also slightly elevated. The patient is out about 5 years from colon cancer. We will check a CMET  and LDH in early December. If the LDH continues to be elevated we may want to consider getting CT scans.

## 2012-08-17 ENCOUNTER — Telehealth: Payer: Self-pay

## 2012-08-17 NOTE — Telephone Encounter (Signed)
S/w pt that her LDH is still elevated and to expect a call from scheduler for labs in early Dec. Pt asked if running 1/2 mile daily is detrimental, and pt assured it is OK to run that much.

## 2012-08-21 ENCOUNTER — Telehealth: Payer: Self-pay | Admitting: Oncology

## 2012-08-21 NOTE — Telephone Encounter (Signed)
S/w pt re appt for 12/2 °

## 2012-09-10 ENCOUNTER — Ambulatory Visit: Payer: Medicare Other | Admitting: Oncology

## 2012-09-10 ENCOUNTER — Other Ambulatory Visit (HOSPITAL_BASED_OUTPATIENT_CLINIC_OR_DEPARTMENT_OTHER): Payer: Medicare Other

## 2012-09-10 ENCOUNTER — Encounter: Payer: Self-pay | Admitting: Oncology

## 2012-09-10 DIAGNOSIS — C180 Malignant neoplasm of cecum: Secondary | ICD-10-CM

## 2012-09-10 DIAGNOSIS — Z85038 Personal history of other malignant neoplasm of large intestine: Secondary | ICD-10-CM

## 2012-09-10 DIAGNOSIS — R945 Abnormal results of liver function studies: Secondary | ICD-10-CM | POA: Insufficient documentation

## 2012-09-10 LAB — COMPREHENSIVE METABOLIC PANEL (CC13)
ALT: 32 U/L (ref 0–55)
Albumin: 3.3 g/dL — ABNORMAL LOW (ref 3.5–5.0)
Alkaline Phosphatase: 152 U/L — ABNORMAL HIGH (ref 40–150)
CO2: 28 mEq/L (ref 22–29)
Glucose: 103 mg/dl — ABNORMAL HIGH (ref 70–99)
Potassium: 3.6 mEq/L (ref 3.5–5.1)
Sodium: 143 mEq/L (ref 136–145)
Total Bilirubin: 0.45 mg/dL (ref 0.20–1.20)
Total Protein: 6.7 g/dL (ref 6.4–8.3)

## 2012-09-10 NOTE — Progress Notes (Signed)
No actual encounter.  I was ordering a CT scan due to abnormal LFTs.

## 2012-09-10 NOTE — Progress Notes (Signed)
This patient initially had an elevated LDH first detected on 07/10/2012. Since then, the patient's albumin has progressively decreased and the alkaline phosphatase has progressively increased. These values came back 3.3 and 152 respectively on 09/10/2012. Her latest LDH was 244 which now is in the normal range after the normal range was modified.  Because of this patient's history of colon cancer and these abnormalities, we will go ahead and obtain a CT scan of the abdomen and pelvis with IV contrast on or about 09/17/2012.

## 2012-09-11 ENCOUNTER — Telehealth: Payer: Self-pay | Admitting: *Deleted

## 2012-09-11 ENCOUNTER — Telehealth: Payer: Self-pay | Admitting: Medical Oncology

## 2012-09-11 NOTE — Telephone Encounter (Signed)
I spoke with pt to let her know that her LDH was int he normal range. Her alkaline phosphatase is still elevated. Due to her history of colon cancer and her recent labs elevations, he has ordered a CT to follow up. She voiced understanding.

## 2012-09-11 NOTE — Telephone Encounter (Signed)
Looking at the patient schedule she has been scheduled for scan on 09-17-2012 arrival time is 3:45pm

## 2012-09-17 ENCOUNTER — Ambulatory Visit (HOSPITAL_COMMUNITY)
Admission: RE | Admit: 2012-09-17 | Discharge: 2012-09-17 | Disposition: A | Discharge status: Home / Self Care | Payer: Medicare Other | Source: Ambulatory Visit | Attending: Oncology | Admitting: Oncology

## 2012-09-17 DIAGNOSIS — R945 Abnormal results of liver function studies: Secondary | ICD-10-CM

## 2012-09-17 DIAGNOSIS — Z85038 Personal history of other malignant neoplasm of large intestine: Secondary | ICD-10-CM

## 2012-09-17 DIAGNOSIS — Z9049 Acquired absence of other specified parts of digestive tract: Secondary | ICD-10-CM | POA: Insufficient documentation

## 2012-09-17 DIAGNOSIS — R7989 Other specified abnormal findings of blood chemistry: Secondary | ICD-10-CM | POA: Insufficient documentation

## 2012-09-17 DIAGNOSIS — C189 Malignant neoplasm of colon, unspecified: Secondary | ICD-10-CM | POA: Insufficient documentation

## 2012-09-17 DIAGNOSIS — K439 Ventral hernia without obstruction or gangrene: Secondary | ICD-10-CM | POA: Insufficient documentation

## 2012-09-17 MED ORDER — IOHEXOL 300 MG/ML  SOLN
100.0000 mL | Freq: Once | INTRAMUSCULAR | Status: AC | PRN
  Administered 2012-09-17: 100 mL via INTRAVENOUS

## 2012-09-17 NOTE — Progress Notes (Signed)
Quick Note:  Please notify patient and call/fax these results to patient's doctors. ______ 

## 2012-09-18 ENCOUNTER — Telehealth: Payer: Self-pay | Admitting: Medical Oncology

## 2012-09-18 NOTE — Telephone Encounter (Signed)
I called pt and left her a message with her CT results per Dr. Mamie Levers request.

## 2012-11-24 ENCOUNTER — Other Ambulatory Visit: Payer: Self-pay

## 2012-12-04 ENCOUNTER — Other Ambulatory Visit: Payer: Self-pay | Admitting: Rheumatology

## 2012-12-04 DIAGNOSIS — R945 Abnormal results of liver function studies: Secondary | ICD-10-CM

## 2012-12-04 DIAGNOSIS — R7989 Other specified abnormal findings of blood chemistry: Secondary | ICD-10-CM

## 2012-12-10 ENCOUNTER — Telehealth: Payer: Self-pay | Admitting: Oncology

## 2012-12-10 ENCOUNTER — Other Ambulatory Visit: Payer: Medicare Other

## 2012-12-10 NOTE — Telephone Encounter (Signed)
talked to pt and gave her new appt time for 01/08/13

## 2012-12-24 ENCOUNTER — Ambulatory Visit
Admission: RE | Admit: 2012-12-24 | Discharge: 2012-12-24 | Disposition: A | Discharge status: Home / Self Care | Payer: Medicare Other | Source: Ambulatory Visit | Attending: Rheumatology | Admitting: Rheumatology

## 2012-12-24 DIAGNOSIS — R945 Abnormal results of liver function studies: Secondary | ICD-10-CM

## 2012-12-24 DIAGNOSIS — R7989 Other specified abnormal findings of blood chemistry: Secondary | ICD-10-CM

## 2013-01-08 ENCOUNTER — Ambulatory Visit: Payer: Medicare Other | Admitting: Physician Assistant

## 2013-01-08 ENCOUNTER — Other Ambulatory Visit: Payer: Medicare Other | Admitting: Lab

## 2013-01-22 ENCOUNTER — Encounter (HOSPITAL_COMMUNITY): Payer: Self-pay | Admitting: *Deleted

## 2013-01-22 ENCOUNTER — Other Ambulatory Visit: Payer: Self-pay

## 2013-01-22 ENCOUNTER — Emergency Department (HOSPITAL_COMMUNITY)
Admission: EM | Admit: 2013-01-22 | Discharge: 2013-01-22 | Disposition: A | Discharge status: Home / Self Care | Payer: Medicare Other | Attending: Emergency Medicine | Admitting: Emergency Medicine

## 2013-01-22 ENCOUNTER — Emergency Department (HOSPITAL_COMMUNITY): Payer: Medicare Other

## 2013-01-22 DIAGNOSIS — D509 Iron deficiency anemia, unspecified: Secondary | ICD-10-CM | POA: Insufficient documentation

## 2013-01-22 DIAGNOSIS — Z79899 Other long term (current) drug therapy: Secondary | ICD-10-CM | POA: Insufficient documentation

## 2013-01-22 DIAGNOSIS — Z85038 Personal history of other malignant neoplasm of large intestine: Secondary | ICD-10-CM | POA: Insufficient documentation

## 2013-01-22 DIAGNOSIS — Z860100 Personal history of colon polyps, unspecified: Secondary | ICD-10-CM | POA: Insufficient documentation

## 2013-01-22 DIAGNOSIS — R0789 Other chest pain: Secondary | ICD-10-CM

## 2013-01-22 DIAGNOSIS — Z8619 Personal history of other infectious and parasitic diseases: Secondary | ICD-10-CM | POA: Insufficient documentation

## 2013-01-22 DIAGNOSIS — Z8739 Personal history of other diseases of the musculoskeletal system and connective tissue: Secondary | ICD-10-CM | POA: Insufficient documentation

## 2013-01-22 LAB — COMPREHENSIVE METABOLIC PANEL
ALT: 34 U/L (ref 0–35)
Albumin: 3.6 g/dL (ref 3.5–5.2)
Alkaline Phosphatase: 136 U/L — ABNORMAL HIGH (ref 39–117)
Potassium: 3.7 mEq/L (ref 3.5–5.1)
Sodium: 139 mEq/L (ref 135–145)
Total Protein: 7.8 g/dL (ref 6.0–8.3)

## 2013-01-22 LAB — CBC
MCHC: 34.9 g/dL (ref 30.0–36.0)
RDW: 15.8 % — ABNORMAL HIGH (ref 11.5–15.5)

## 2013-01-22 LAB — POCT I-STAT TROPONIN I: Troponin i, poc: 0 ng/mL (ref 0.00–0.08)

## 2013-01-22 MED ORDER — NITROGLYCERIN 0.4 MG SL SUBL: 0.4000 mg | SUBLINGUAL_TABLET | SUBLINGUAL | Status: DC | PRN

## 2013-01-22 MED ORDER — ASPIRIN 325 MG PO TABS
325.0000 mg | ORAL_TABLET | Freq: Once | ORAL | Status: AC
  Administered 2013-01-22: 325 mg via ORAL
  Filled 2013-01-22: qty 1

## 2013-01-22 NOTE — ED Provider Notes (Signed)
History     CSN: [id number]  Arrival date & time 01/22/13  1738   First MD Initiated Contact with Patient 01/22/13 1746      Chief Complaint  Patient presents with  . Chest Pain    (Consider location/radiation/quality/duration/timing/severity/associated sxs/prior treatment) Patient is a 52 y.o. female presenting with chest pain. The history is provided by the patient.  Chest Pain Pain location:  Substernal area Pain quality: aching and sharp   Pain radiates to:  Does not radiate Pain radiates to the back: no   Pain severity:  Mild Onset quality:  Sudden Duration:  4 days Timing:  Constant Progression:  Unchanged Chronicity:  New Context comment:  Palpation or movement Relieved by: lying flat and not pushing on chest wall. Associated symptoms: no abdominal pain, no back pain, no cough, no fever, no palpitations, no shortness of breath and not vomiting   Risk factors: no aortic disease     Past Medical History  Diagnosis Date  . Sjoegren syndrome   . Colon cancer     finished chemo feb 2009  . Iron deficiency anemia   . Helicobacter pylori gastritis 12/23/08  . Hyperplastic colon polyp 12/23/08    Past Surgical History  Procedure Laterality Date  . Hemicolectomy      2008  . Dilation and curettage of uterus      x 7  . Breast reduction surgery      Family History  Problem Relation Age of Onset  . Anemia Mother   . Diabetes Mother   . Kidney disease Mother   . Colon cancer Neg Hx     History  Substance Use Topics  . Smoking status: Never Smoker   . Smokeless tobacco: Never Used  . Alcohol Use: No    OB History   Grav Para Term Preterm Abortions TAB SAB Ect Mult Living                  Review of Systems  Constitutional: Negative for fever, chills, activity change and appetite change.  HENT: Negative for neck pain and neck stiffness.   Respiratory: Negative for cough, chest tightness, shortness of breath and wheezing.   Cardiovascular: Positive  for chest pain. Negative for palpitations and leg swelling.  Gastrointestinal: Negative for vomiting, abdominal pain, diarrhea and constipation.  Genitourinary: Negative for dysuria, decreased urine volume and difficulty urinating.  Musculoskeletal: Negative for back pain.  Skin: Negative for rash and wound.  Neurological: Negative for syncope and light-headedness.  All other systems reviewed and are negative.    Allergies  Review of patient's allergies indicates no known allergies.  Home Medications   Current Outpatient Rx  Name  Route  Sig  Dispense  Refill  . amLODipine (NORVASC) 10 MG tablet   Oral   Take 10 mg by mouth daily.           . cholecalciferol (VITAMIN D) 1000 UNITS tablet   Oral   Take 1,000 Units by mouth daily.           . ferrous sulfate 325 (65 FE) MG tablet   Oral   Take 325 mg by mouth 2 (two) times daily.           . mycophenolate (CELLCEPT) 500 MG tablet   Oral   Take 500 mg by mouth 2 (two) times daily.           . traMADol (ULTRAM) 50 MG tablet   Oral   Take 50 mg  by mouth 3 (three) times daily as needed. For pain. Maximum dose= 8 tablets per day          . vitamin B-12 (CYANOCOBALAMIN) 1000 MCG tablet   Oral   Take 1,000 mcg by mouth daily.             BP 128/87  Pulse 76  Temp(Src) 98.1 F (36.7 C) (Oral)  Resp 16  SpO2 100%  Physical Exam  Nursing note and vitals reviewed. Constitutional: She is oriented to person, place, and time. She appears well-developed and well-nourished.  HENT:  Head: Normocephalic and atraumatic.  Right Ear: External ear normal.  Left Ear: External ear normal.  Nose: Nose normal.  Mouth/Throat: Oropharynx is clear and moist. No oropharyngeal exudate.  Eyes: Conjunctivae are normal. Pupils are equal, round, and reactive to light.  Neck: Normal range of motion. Neck supple.  Cardiovascular: Normal rate, regular rhythm, normal heart sounds and intact distal pulses.  Exam reveals no gallop and  no friction rub.   No murmur heard. Pulmonary/Chest: Effort normal and breath sounds normal. No respiratory distress. She has no wheezes. She has no rales. She exhibits tenderness (localized to single location over mid-sternum).  Abdominal: Soft. Bowel sounds are normal. She exhibits no distension and no mass. There is no tenderness. There is no rebound and no guarding.  Musculoskeletal: Normal range of motion. She exhibits no edema and no tenderness.  Neurological: She is alert and oriented to person, place, and time.  Skin: Skin is warm and dry.  Psychiatric: She has a normal mood and affect. Her behavior is normal. Judgment and thought content normal.    ED Course  Procedures (including critical care time)  Labs Reviewed  CBC - Abnormal; Notable for the following:    MCV 74.4 (*)    RDW 15.8 (*)    All other components within normal limits  COMPREHENSIVE METABOLIC PANEL - Abnormal; Notable for the following:    Glucose, Bld 69 (*)    Alkaline Phosphatase 136 (*)    GFR calc non Af Amer 65 (*)    GFR calc Af Amer 76 (*)    All other components within normal limits  POCT I-STAT TROPONIN I   Dg Chest 2 View  01/22/2013  *RADIOLOGY REPORT*  Clinical Data: Chest pain.  CHEST - 2 VIEW  Comparison: 08/23/2011  Findings: The heart size and pulmonary vascularity are normal and the lungs are clear.  Slight tortuosity of the thoracic aorta.  No osseous abnormality.  IMPRESSION: No acute disease.   Original Report Authenticated By: Francene Boyers, M.D.    EKG: normal EKG, normal sinus rhythm (60 bpm), unchanged from previous tracings (08/23/11).   1. Musculoskeletal chest pain       MDM  52 yo F presents for 4 days of constant substernal chest pain, reproducible with palpation and movement. Pain and tenderness localized to a single location overlying sternum. No associated diaphoresis, nausea, or light-headedness. AFVSS. EKG not c/w acute ischemia or arrythmia. Clinical picture not  concerning for ACS, pulmonary embolism, aortic dissection, PTX, or esophageal rupture. Pt counseled on use of prescribed pain medications for symptomatic treatment. Patient given return precautions, including worsening of signs or symptoms. Patient instructed to follow-up with primary care physician.          Clemetine Marker, MD 01/22/13 1900

## 2013-01-22 NOTE — ED Notes (Signed)
Patient given discharge instructions for chest wall pain. No rx was provided. Advised to follow up with primary care or to return to this department if condition worsens. Patient voiced understanding of all instructions and had no further questions. Patient ambulated to front lobby without difficulty.

## 2013-01-22 NOTE — ED Notes (Signed)
Pt with sternal chest pain that she describes as "sharp" x 4 days with intermittent sob, esp when she bends over.  Pain is constant and keeps her awake.

## 2013-01-22 NOTE — ED Provider Notes (Signed)
I saw and evaluated the patient, reviewed the resident's note and I agree with the findings including ECG and plan.  This 52 year old female is a constant sharp stabbing well localized nonradiating without associated symptoms left parasternal chest wall discomfort exactly reproducible on examination it is positional and nonexertional nonpleuritic and I doubt ACS or PE.  Hurman Horn, MD 01/25/13 2024

## 2013-07-09 ENCOUNTER — Other Ambulatory Visit: Payer: Self-pay | Admitting: Medical Oncology

## 2013-07-09 DIAGNOSIS — Z85038 Personal history of other malignant neoplasm of large intestine: Secondary | ICD-10-CM

## 2013-07-11 ENCOUNTER — Telehealth: Payer: Self-pay | Admitting: Internal Medicine

## 2013-07-11 NOTE — Telephone Encounter (Signed)
, °

## 2013-07-16 ENCOUNTER — Telehealth: Payer: Self-pay | Admitting: *Deleted

## 2013-07-16 NOTE — Telephone Encounter (Signed)
Lm informed the pt that her appts for 10.30.14 has changed to the am. gv appt 08/08/13 with labs@ 8:30am and ov @ 9am. Made the pt aware that i will mail a letter/cal...td

## 2013-08-08 ENCOUNTER — Other Ambulatory Visit: Payer: Medicare Other

## 2013-08-08 ENCOUNTER — Other Ambulatory Visit (HOSPITAL_BASED_OUTPATIENT_CLINIC_OR_DEPARTMENT_OTHER): Payer: BC Managed Care – PPO | Admitting: Lab

## 2013-08-08 ENCOUNTER — Other Ambulatory Visit: Payer: Self-pay | Admitting: Internal Medicine

## 2013-08-08 ENCOUNTER — Telehealth: Payer: Self-pay | Admitting: Internal Medicine

## 2013-08-08 ENCOUNTER — Ambulatory Visit: Payer: Medicare Other

## 2013-08-08 ENCOUNTER — Ambulatory Visit (HOSPITAL_BASED_OUTPATIENT_CLINIC_OR_DEPARTMENT_OTHER): Payer: BC Managed Care – PPO | Admitting: Internal Medicine

## 2013-08-08 VITALS — BP 130/75 | HR 66 | Temp 97.1°F | Resp 18 | Ht 69.0 in | Wt 203.6 lb

## 2013-08-08 DIAGNOSIS — Z85038 Personal history of other malignant neoplasm of large intestine: Secondary | ICD-10-CM

## 2013-08-08 DIAGNOSIS — R945 Abnormal results of liver function studies: Secondary | ICD-10-CM

## 2013-08-08 DIAGNOSIS — R1013 Epigastric pain: Secondary | ICD-10-CM

## 2013-08-08 LAB — LACTATE DEHYDROGENASE (CC13): LDH: 209 U/L (ref 125–245)

## 2013-08-08 LAB — CBC WITH DIFFERENTIAL/PLATELET
Basophils Absolute: 0 10*3/uL (ref 0.0–0.1)
Eosinophils Absolute: 0.1 10*3/uL (ref 0.0–0.5)
HGB: 12.4 g/dL (ref 11.6–15.9)
MCV: 78.1 fL — ABNORMAL LOW (ref 79.5–101.0)
MONO#: 0.3 10*3/uL (ref 0.1–0.9)
MONO%: 7.4 % (ref 0.0–14.0)
NEUT#: 2.3 10*3/uL (ref 1.5–6.5)
Platelets: 245 10*3/uL (ref 145–400)
RDW: 16.2 % — ABNORMAL HIGH (ref 11.2–14.5)

## 2013-08-08 LAB — COMPREHENSIVE METABOLIC PANEL (CC13)
Albumin: 3.2 g/dL — ABNORMAL LOW (ref 3.5–5.0)
Alkaline Phosphatase: 122 U/L (ref 40–150)
Anion Gap: 9 mEq/L (ref 3–11)
BUN: 11.2 mg/dL (ref 7.0–26.0)
CO2: 27 mEq/L (ref 22–29)
Calcium: 9.2 mg/dL (ref 8.4–10.4)
Glucose: 117 mg/dl (ref 70–140)
Potassium: 3.5 mEq/L (ref 3.5–5.1)

## 2013-08-08 LAB — CEA: CEA: 1.8 ng/mL (ref 0.0–5.0)

## 2013-08-08 NOTE — Telephone Encounter (Signed)
gv and printed appt sched and avs for pt for NOV 2015 °

## 2013-08-08 NOTE — Progress Notes (Signed)
[facility] OFFICE PROGRESS NOTE  Katy Apo, MD [address] [place] [address]  DIAGNOSIS: H/O colon cancer, stage III - Plan: CBC with Differential, Comprehensive metabolic panel, Iron and TIBC, Ferritin, CEA  Nonspecific abnormal results of liver function study  Abdominal pain, epigastric  Chief Complaint  Patient presents with  . H/O colon cancer, stage III    CURRENT THERAPY: Observation.   INTERVAL HISTORY: [name] 52 y.o. 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 She was last seen by NP Maryruth Hancock and Dr. Arline Asp on 07/10/2012.  [name] denies any other symptomatology today including abdominal pain, chills, unusual bleeding, fever, other pain, night sweats, N/V/D and constipation. The patient is on oral iron and has tendency toward constipation in the past. In general she seems to be doing fairly well, she remains active and states that she runs a half-mile daily. She denies any weight loss or any hematochezia or melena. Denies any abdominal pain, nausea vomiting, or digestive problems. She was seen earlier this year by Dr. Nehemiah Settle, her primary care physician without any changes in her medications. Reports have an a mammogram done in March of this year that was negative. She also reports tingling and numbness in the hands and feet bilaterally for which she takes Ultram.  MEDICAL HISTORY: Past Medical History  Diagnosis Date  . Sjoegren syndrome   . Colon cancer     finished chemo feb 2009  . Iron deficiency anemia   . Helicobacter pylori gastritis 12/23/08  . Hyperplastic colon polyp 12/23/08    INTERIM HISTORY: has H/O colon cancer, stage III; Abdominal pain, epigastric; and Nonspecific abnormal results of liver function study on her problem list.    ALLERGIES:  has No Known Allergies.  MEDICATIONS: has a current medication list which  includes the following prescription(s): amlodipine, cholecalciferol, ferrous sulfate, mycophenolate, tramadol, and vitamin b-12.  SURGICAL HISTORY:  Past Surgical History  Procedure Laterality Date  . Hemicolectomy      2008  . Dilation and curettage of uterus      x 7  . Breast reduction surgery     PROBLEM LIST: 1. Moderately differentiated adenocarcinoma of the colon, ileocecal valve diagnosed and treated when the patient was living in [place], [place]. Her oncologist was Dr. Minta Balsam. She presented with iron-deficiency anemia. Biopsy on colonoscopy was obtained on 03/27/2007. The patient underwent lap hand-assisted right hemicolectomy on 04/20/2007. Tumor stage was T3 N1 with 1 out of 30 lymph nodes positive, IIIB. She underwent adjuvant chemotherapy with FOLFOX for 12 cycles from May 15, 2007, through November 15, 2007. The patient remains in a state of complete remission. Family history for colon cancer was negative. [name] was carried out in March 2011 and is scheduled to be repeated again in 2014.  2. 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 The patient has seen Dr. Levert Feinstein for consultation.  3. Sjogren syndrome diagnosed in October 2010, currently seeing Dr. Azzie Roup.  4. Hypertension.  5. History of iron-deficiency anemia.  6. Thyroid enlargement noted on CT angiogram of chest carried out on 08/23/2011.  7. Cardiomegaly noted on CT angiogram of chest 08/23/2011.  8. Epigastric pain with onset January 2013.  REVIEW OF SYSTEMS:   Constitutional: Denies fevers, chills or abnormal weight loss Eyes: Denies blurriness of vision Ears, nose, mouth, throat, and face: Denies mucositis  or sore throat Respiratory: Denies cough, dyspnea or wheezes Cardiovascular: Denies palpitation, chest discomfort or lower extremity swelling Gastrointestinal:  Denies nausea, heartburn or change in bowel  habits Skin: Denies abnormal skin rashes Lymphatics: Denies new lymphadenopathy or easy bruising Neurological:Denies numbness, tingling or new weaknesses Behavioral/Psych: Mood is stable, no new changes  All other systems were reviewed with the patient and are negative.  PHYSICAL EXAMINATION: ECOG PERFORMANCE STATUS: 0 - Asymptomatic  Blood pressure 130/75, pulse 66, temperature 97.1 F (36.2 C), temperature source Oral, resp. rate 18, height 5\' 9"  (1.753 m), weight 203 lb 9.6 oz (92.352 kg).  GENERAL:alert, no distress and comfortable; well-developed well-nourished. SKIN: skin color, texture, turgor are normal, no rashes or significant lesions EYES: normal, Conjunctiva are pink and non-injected, sclera clear OROPHARYNX:no exudate, no erythema and lips, buccal mucosa, and tongue normal  NECK: supple, thyroid moderately enlarged size, non-tender, without nodularity LYMPH:  no palpable lymphadenopathy in the cervical, axillary or supraclavicular LUNGS: clear to auscultation and percussion with normal breathing effort HEART: regular rate & rhythm and no murmurs and no lower extremity edema ABDOMEN:abdomen soft, non-tender and normal bowel sounds Musculoskeletal:no cyanosis of digits and no clubbing  NEURO: alert & oriented x 3 with fluent speech, no focal motor/sensory deficits   LABORATORY DATA: Results for orders placed in visit on 08/08/13 (from the past 48 hour(s))  CBC WITH DIFFERENTIAL     Status: Abnormal   Collection Time    08/08/13  8:38 AM      Result Value Range   WBC 4.3  3.9 - 10.3 10e3/uL   NEUT# 2.3  1.5 - 6.5 10e3/uL   HGB 12.4  11.6 - 15.9 g/dL   HCT 16.1  09.6 - 04.5 %   Platelets 245  145 - 400 10e3/uL   MCV 78.1 (*) 79.5 - 101.0 fL   MCH 25.4  25.1 - 34.0 pg   MCHC 32.5  31.5 - 36.0 g/dL   RBC 4.09  8.11 - 9.14 10e6/uL   RDW 16.2 (*) 11.2 - 14.5 %   lymph# 1.6  0.9 - 3.3 10e3/uL   MONO# 0.3  0.1 - 0.9 10e3/uL   Eosinophils Absolute 0.1  0.0 - 0.5 10e3/uL    Basophils Absolute 0.0  0.0 - 0.1 10e3/uL   NEUT% 52.6  38.4 - 76.8 %   LYMPH% 36.9  14.0 - 49.7 %   MONO% 7.4  0.0 - 14.0 %   EOS% 2.6  0.0 - 7.0 %   BASO% 0.5  0.0 - 2.0 %  COMPREHENSIVE METABOLIC PANEL (CC13)     Status: Abnormal   Collection Time    08/08/13  8:38 AM      Result Value Range   Sodium 142  136 - 145 mEq/L   Potassium 3.5  3.5 - 5.1 mEq/L   Chloride 107  98 - 109 mEq/L   CO2 27  22 - 29 mEq/L   Glucose 117  70 - 140 mg/dl   BUN 78.2  7.0 - 95.6 mg/dL   Creatinine 0.8  0.6 - 1.1 mg/dL   Total Bilirubin 2.13  0.20 - 1.20 mg/dL   Alkaline Phosphatase 122  40 - 150 U/L   AST 29  5 - 34 U/L   ALT 32  0 - 55 U/L   Total Protein 7.6  6.4 - 8.3 g/dL   Albumin 3.2 (*) 3.5 - 5.0 g/dL   Calcium 9.2  8.4 - 08.6 mg/dL   Anion Gap 9  3 - 11 mEq/L  CEA     Status: None   Collection Time    08/08/13  8:39 AM      Result Value Range   CEA 1.8  0.0 - 5.0 ng/mL  LACTATE DEHYDROGENASE (CC13)     Status: None   Collection Time    08/08/13  8:39 AM      Result Value Range   LDH 209  125 - 245 U/L    Labs:  Lab Results  Component Value Date   WBC 4.3 08/08/2013   HGB 12.4 08/08/2013   HCT 38.2 08/08/2013   MCV 78.1* 08/08/2013   PLT 245 08/08/2013   NEUTROABS 2.3 08/08/2013      Chemistry      Component Value Date/Time   NA 142 08/08/2013 0838   NA 139 01/22/2013 1750   K 3.5 08/08/2013 0838   K 3.7 01/22/2013 1750   CL 103 01/22/2013 1750   CL 106 09/10/2012 1404   CO2 27 08/08/2013 0838   CO2 29 01/22/2013 1750   BUN 11.2 08/08/2013 0838   BUN 19 01/22/2013 1750   CREATININE 0.8 08/08/2013 0838   CREATININE 0.98 01/22/2013 1750      Component Value Date/Time   CALCIUM 9.2 08/08/2013 0838   CALCIUM 9.6 01/22/2013 1750   ALKPHOS 122 08/08/2013 0838   ALKPHOS 136* 01/22/2013 1750   AST 29 08/08/2013 0838   AST 35 01/22/2013 1750   ALT 32 08/08/2013 0838   ALT 34 01/22/2013 1750   BILITOT 0.71 08/08/2013 0838   BILITOT 0.3 01/22/2013 1750     Basic  Metabolic Panel:  Recent Labs Lab 08/08/13 0838  NA 142  K 3.5  CO2 27  GLUCOSE 117  BUN 11.2  CREATININE 0.8  CALCIUM 9.2   GFR Estimated Creatinine Clearance: 98.5 ml/min (by C-G formula based on Cr of 0.8). Liver Function Tests:  Recent Labs Lab 08/08/13 0838  AST 29  ALT 32  ALKPHOS 122  BILITOT 0.71  PROT 7.6  ALBUMIN 3.2*   CBC:  Recent Labs Lab 08/08/13 0838  WBC 4.3  NEUTROABS 2.3  HGB 12.4  HCT 38.2  MCV 78.1*  PLT 245   Studies:  Results for KEARSTIN, LEARN (MRN [id number]) as of 08/09/2013 14:03  Ref. Range 08/08/2013 08:39  LDH Latest Range: 125-245 U/L 209   Results for MAKAYIA, DUPLESSIS (MRN [id number]) as of 08/09/2013 14:03  Ref. Range 10/26/2010 14:55 12/29/2011 09:28 07/10/2012 10:53 08/08/2013 08:39  CEA Latest Range: 0.0-5.0 ng/mL 1.3 1.2 1.4 1.8   RADIOGRAPHIC STUDIES: No results found.  ASSESSMENT: KASSIDIE HENDRIKS 52 y.o. female with a history of H/O colon cancer, stage III - Plan: CBC with Differential, Comprehensive metabolic panel, Iron and TIBC, Ferritin, CEA  Nonspecific abnormal results of liver function study  Abdominal pain, epigastric   PLAN: 1. Adenocarcinoma of the colon, ileo cecal valve, T3N1, IIIB s/p adjuvant FOLFOX x 12 cycle ending 11/2007.  --The patient seems to be doing fairly well now over 5 years from the time of diagnosis of her colon cancer without evidence of recurrence. Her LDH level is within normal limits.  CEA level is 1.8.   2. Neuropathy Secondary to Chemotherapy.  --Patient reports peripheral sensory neuropathy in her hands and feet bilaterally which she takes Ultram with moderate relief.   3. Follow-up. --Ms. Casey will return in 6 months, at which time we will check CBC, chemistries, CEA, iron studies.  All questions were answered.  The patient knows to call the clinic with any problems, questions or concerns. We can certainly see the patient much sooner if necessary.  I spent 10 minutes counseling the  patient face to face. The total time spent in the appointment was 15 minutes.    Likisha Alles, MD 08/09/2013 1:57 PM

## 2013-08-15 ENCOUNTER — Other Ambulatory Visit: Payer: Self-pay

## 2014-02-12 ENCOUNTER — Telehealth: Payer: Self-pay

## 2014-02-12 NOTE — Telephone Encounter (Signed)
Faxed pt medical records to Dr Romona Curls

## 2014-07-25 ENCOUNTER — Other Ambulatory Visit: Payer: Self-pay

## 2014-08-13 ENCOUNTER — Other Ambulatory Visit: Payer: Self-pay | Admitting: *Deleted

## 2014-08-13 DIAGNOSIS — Z85038 Personal history of other malignant neoplasm of large intestine: Secondary | ICD-10-CM

## 2014-08-14 ENCOUNTER — Other Ambulatory Visit: Payer: BC Managed Care – PPO

## 2014-08-14 ENCOUNTER — Ambulatory Visit: Payer: BC Managed Care – PPO

## 2018-03-28 MED FILL — traMADol HCL 50 MG TABS: 50 | 30 days supply | Qty: 90 | Fill #0

## 2018-03-28 MED FILL — VIT D2 1.25 MG (50,000 UNIT: 1.25 MG | 84 days supply | Qty: 12 | Fill #0

## 2018-04-04 ENCOUNTER — Other Ambulatory Visit: Payer: Self-pay

## 2018-04-04 ENCOUNTER — Ambulatory Visit (HOSPITAL_COMMUNITY)
Admission: EM | Admit: 2018-04-04 | Discharge: 2018-04-04 | Disposition: A | Discharge status: Home / Self Care | Payer: Medicare Other | Attending: Family Medicine | Admitting: Family Medicine

## 2018-04-04 ENCOUNTER — Encounter (HOSPITAL_COMMUNITY): Payer: Self-pay | Admitting: Emergency Medicine

## 2018-04-04 DIAGNOSIS — M3500 Sicca syndrome, unspecified: Secondary | ICD-10-CM | POA: Diagnosis not present

## 2018-04-04 DIAGNOSIS — M25461 Effusion, right knee: Secondary | ICD-10-CM | POA: Diagnosis not present

## 2018-04-04 MED ORDER — METHYLPREDNISOLONE ACETATE 80 MG/ML IJ SUSP
INTRAMUSCULAR | Status: AC
  Filled 2018-04-04: qty 1

## 2018-04-04 MED ORDER — PREDNISONE 20 MG PO TABS: ORAL_TABLET | ORAL | 0 refills | Status: DC

## 2018-04-04 MED ORDER — BUPIVACAINE HCL (PF) 0.5 % IJ SOLN
INTRAMUSCULAR | Status: AC
  Filled 2018-04-04: qty 10

## 2018-04-04 NOTE — Discharge Instructions (Addendum)
Take the prednisone if the pain is not at least 50% better by morning.  Ice tonight.

## 2018-04-04 NOTE — ED Triage Notes (Signed)
Right knee pain, swelling.  Onset of symptoms started last Wednesday.  Patient has iced knee and elevated when ever possible.  Pain is not improving.  No known injury

## 2018-04-04 NOTE — ED Provider Notes (Signed)
[facility]   [id number] 04/04/18 Arrival Time: 1943   SUBJECTIVE:  [name] is a 57 y.o. female who presents to the urgent care with complaint of Right knee pain, swelling.  Onset of symptoms started last Wednesday.  Patient has iced knee and elevated when ever possible.  Pain is not improving.  No known injury  Past Medical History:  Diagnosis Date  . Colon cancer ([place])    finished chemo feb 2009  . Helicobacter pylori gastritis 12/23/08  . Hyperplastic colon polyp 12/23/08  . Iron deficiency anemia   . Sjoegren syndrome    Family History  Problem Relation Age of Onset  . Anemia Mother   . Diabetes Mother   . Kidney disease Mother   . Colon cancer Neg Hx    Social History   Socioeconomic History  . Marital status: Married    Spouse name: Not on file  . Number of children: Not on file  . Years of education: Not on file  . Highest education level: Not on file  Occupational History  . Not on file  Social Needs  . Financial resource strain: Not on file  . Food insecurity:    Worry: Not on file    Inability: Not on file  . Transportation needs:    Medical: Not on file    Non-medical: Not on file  Tobacco Use  . Smoking status: Never Smoker  . Smokeless tobacco: Never Used  Substance and Sexual Activity  . Alcohol use: No  . Drug use: No  . Sexual activity: Not on file  Lifestyle  . Physical activity:    Days per week: Not on file    Minutes per session: Not on file  . Stress: Not on file  Relationships  . Social connections:    Talks on phone: Not on file    Gets together: Not on file    Attends religious service: Not on file    Active member of club or organization: Not on file    Attends meetings of clubs or organizations: Not on file    Relationship status: Not on file  . Intimate partner violence:    Fear of current or ex partner: Not on file    Emotionally abused: Not on file    Physically abused: Not on file    Forced sexual  activity: Not on file  Other Topics Concern  . Not on file  Social History Narrative  . Not on file   No outpatient medications have been marked as taking for the 04/04/18 encounter [facility] Encounter).   Allergies  Allergen Reactions  . Venofer [Ferric Oxide] Anaphylaxis    Cardiac arrest  . Dilaudid [Hydromorphone Hcl]       ROS: As per HPI, remainder of ROS negative.   OBJECTIVE:   Vitals:   04/04/18 1959  BP: 129/90  Pulse: 71  Resp: 20  Temp: 98.2 F (36.8 C)  TempSrc: Oral  SpO2: 100%     General appearance: alert; no distress Eyes: PERRL; EOMI; conjunctiva normal HENT: normocephalic; atraumatic;; oral mucosa normal Neck: supple Back: no CVA tenderness Extremities: no cyanosis or edema; moderate right knee effusion with decreased ROM secondary to pain Skin: warm and dry Neurologic: normal gait; grossly normal Psychological: alert and cooperative; normal mood and affect      Labs:  Results for orders placed or performed in visit on 08/08/13  CBC with Differential  Result Value Ref Range   WBC 4.3 3.9 -  10.3 10e3/uL   NEUT# 2.3 1.5 - 6.5 10e3/uL   HGB 12.4 11.6 - 15.9 g/dL   HCT 38.2 34.8 - 46.6 %   Platelets 245 145 - 400 10e3/uL   MCV 78.1 (L) 79.5 - 101.0 fL   MCH 25.4 25.1 - 34.0 pg   MCHC 32.5 31.5 - 36.0 g/dL   RBC 4.89 3.70 - 5.45 10e6/uL   RDW 16.2 (H) 11.2 - 14.5 %   lymph# 1.6 0.9 - 3.3 10e3/uL   MONO# 0.3 0.1 - 0.9 10e3/uL   Eosinophils Absolute 0.1 0.0 - 0.5 10e3/uL   Basophils Absolute 0.0 0.0 - 0.1 10e3/uL   NEUT% 52.6 38.4 - 76.8 %   LYMPH% 36.9 14.0 - 49.7 %   MONO% 7.4 0.0 - 14.0 %   EOS% 2.6 0.0 - 7.0 %   BASO% 0.5 0.0 - 2.0 %  Comprehensive metabolic panel  Result Value Ref Range   Sodium 142 136 - 145 mEq/L   Potassium 3.5 3.5 - 5.1 mEq/L   Chloride 107 98 - 109 mEq/L   CO2 27 22 - 29 mEq/L   Glucose 117 70 - 140 mg/dl   BUN 11.2 7.0 - 26.0 mg/dL   Creatinine 0.8 0.6 - 1.1 mg/dL   Total Bilirubin 0.71 0.20 - 1.20  mg/dL   Alkaline Phosphatase 122 40 - 150 U/L   AST 29 5 - 34 U/L   ALT 32 0 - 55 U/L   Total Protein 7.6 6.4 - 8.3 g/dL   Albumin 3.2 (L) 3.5 - 5.0 g/dL   Calcium 9.2 8.4 - 10.4 mg/dL   Anion Gap 9 3 - 11 mEq/L  CEA  Result Value Ref Range   CEA 1.8 0.0 - 5.0 ng/mL  Lactate dehydrogenase  Result Value Ref Range   LDH 209 125 - 245 U/L    Labs Reviewed - No data to display  No results found.     ASSESSMENT & PLAN:  1. Sjogren's syndrome, with unspecified organ involvement ([place])   2. Effusion of right knee   Take the prednisone if the pain is not at least 50% better by morning.  Ice tonight.  Meds ordered this encounter  Medications  . predniSONE (DELTASONE) 20 MG tablet    Sig: Two daily with food    Dispense:  10 tablet    Refill:  0    Reviewed expectations re: course of current medical issues. Questions answered. Outlined signs and symptoms indicating need for more acute intervention. Patient verbalized understanding. After Visit Summary given.    Procedures:  After prepping skin with isopropyl alcohol, right knee was injected with 80 mg Depomedrol and 1 cc of 0.5% bupivacaine.      Robyn Haber, MD 04/04/18 2018

## 2018-04-17 MED FILL — AMLODIPINE BESYLATE 10 MG T: 10 | 90 days supply | Qty: 90 | Fill #0

## 2018-05-14 MED FILL — traMADol HCL 50 MG TABS: 50 | 30 days supply | Qty: 90 | Fill #0

## 2018-05-17 MED FILL — VIT D2 1.25 MG (50,000 UNIT: 1.25 MG | 84 days supply | Qty: 12 | Fill #0

## 2018-06-22 DIAGNOSIS — I10 Essential (primary) hypertension: Secondary | ICD-10-CM | POA: Diagnosis not present

## 2018-06-22 DIAGNOSIS — E663 Overweight: Secondary | ICD-10-CM | POA: Diagnosis not present

## 2018-06-22 DIAGNOSIS — Z23 Encounter for immunization: Secondary | ICD-10-CM | POA: Diagnosis not present

## 2018-07-03 DIAGNOSIS — R7303 Prediabetes: Secondary | ICD-10-CM | POA: Diagnosis not present

## 2018-07-11 MED FILL — AMLODIPINE BESYLATE 10 MG T: 10 | 90 days supply | Qty: 90 | Fill #0

## 2018-07-23 DIAGNOSIS — G894 Chronic pain syndrome: Secondary | ICD-10-CM | POA: Diagnosis not present

## 2018-07-23 MED FILL — traMADol HCL 50 MG TABS: 50 | 30 days supply | Qty: 90 | Fill #0

## 2018-08-20 MED FILL — VIT D2 1.25 MG (50,000 UNIT: 1.25 MG | 84 days supply | Qty: 12 | Fill #1

## 2018-08-22 ENCOUNTER — Ambulatory Visit (HOSPITAL_COMMUNITY)
Admission: EM | Admit: 2018-08-22 | Discharge: 2018-08-22 | Disposition: A | Discharge status: Home / Self Care | Payer: 59

## 2018-08-22 ENCOUNTER — Encounter (HOSPITAL_COMMUNITY): Payer: Self-pay

## 2018-08-22 ENCOUNTER — Emergency Department (HOSPITAL_COMMUNITY)
Admission: EM | Admit: 2018-08-22 | Discharge: 2018-08-22 | Disposition: A | Discharge status: Home / Self Care | Payer: 59 | Attending: Emergency Medicine | Admitting: Emergency Medicine

## 2018-08-22 ENCOUNTER — Other Ambulatory Visit: Payer: Self-pay

## 2018-08-22 ENCOUNTER — Emergency Department (HOSPITAL_COMMUNITY): Payer: 59

## 2018-08-22 DIAGNOSIS — Z85038 Personal history of other malignant neoplasm of large intestine: Secondary | ICD-10-CM | POA: Insufficient documentation

## 2018-08-22 DIAGNOSIS — R0789 Other chest pain: Secondary | ICD-10-CM | POA: Insufficient documentation

## 2018-08-22 DIAGNOSIS — R0602 Shortness of breath: Secondary | ICD-10-CM | POA: Insufficient documentation

## 2018-08-22 DIAGNOSIS — R079 Chest pain, unspecified: Secondary | ICD-10-CM | POA: Diagnosis not present

## 2018-08-22 DIAGNOSIS — Z79899 Other long term (current) drug therapy: Secondary | ICD-10-CM | POA: Insufficient documentation

## 2018-08-22 LAB — BASIC METABOLIC PANEL WITH GFR
Anion gap: 6 (ref 5–15)
BUN: 16 mg/dL (ref 6–20)
CO2: 27 mmol/L (ref 22–32)
Calcium: 9 mg/dL (ref 8.9–10.3)
Chloride: 107 mmol/L (ref 98–111)
Creatinine, Ser: 0.81 mg/dL (ref 0.44–1.00)
GFR calc Af Amer: >60 mL/min (ref >60)
GFR calc non Af Amer: >60 mL/min (ref >60)
Glucose, Bld: 97 mg/dL (ref 70–99)
Potassium: 3.7 mmol/L (ref 3.5–5.1)
Sodium: 140 mmol/L (ref 135–145)

## 2018-08-22 LAB — CBC
HCT: 39.9 % (ref 36.0–46.0)
Hemoglobin: 12.4 g/dL (ref 12.0–15.0)
MCH: 25.4 pg — ABNORMAL LOW (ref 26.0–34.0)
MCHC: 31.1 g/dL (ref 30.0–36.0)
MCV: 81.8 fL (ref 80.0–100.0)
Platelets: 266 K/uL (ref 150–400)
RBC: 4.88 MIL/uL (ref 3.87–5.11)
RDW: 16.5 % — ABNORMAL HIGH (ref 11.5–15.5)
WBC: 5 K/uL (ref 4.0–10.5)
nRBC: 0 % (ref 0.0–0.2)

## 2018-08-22 LAB — D-DIMER, QUANTITATIVE (NOT AT ARMC): D DIMER QUANT: 0.45 ug{FEU}/mL (ref 0.00–0.50)

## 2018-08-22 LAB — I-STAT TROPONIN, ED
TROPONIN I, POC: 0.01 ng/mL (ref 0.00–0.08)
Troponin i, poc: 0.01 ng/mL (ref 0.00–0.08)

## 2018-08-22 LAB — BRAIN NATRIURETIC PEPTIDE: B Natriuretic Peptide: 75.8 pg/mL (ref 0.0–100.0)

## 2018-08-22 MED ORDER — MORPHINE SULFATE (PF) 4 MG/ML IV SOLN: 4.0000 mg | Freq: Once | INTRAVENOUS | Status: DC

## 2018-08-22 MED ORDER — ONDANSETRON HCL 4 MG/2ML IJ SOLN
4.0000 mg | Freq: Once | INTRAMUSCULAR | Status: AC
  Administered 2018-08-22: 4 mg via INTRAVENOUS
  Filled 2018-08-22: qty 2

## 2018-08-22 MED ORDER — MORPHINE SULFATE (PF) 4 MG/ML IV SOLN
4.0000 mg | Freq: Once | INTRAVENOUS | Status: AC
  Administered 2018-08-22: 4 mg via INTRAVENOUS
  Filled 2018-08-22: qty 1

## 2018-08-22 MED ORDER — KETOROLAC TROMETHAMINE 30 MG/ML IJ SOLN
30.0000 mg | Freq: Once | INTRAMUSCULAR | Status: AC
  Administered 2018-08-22: 30 mg via INTRAVENOUS
  Filled 2018-08-22: qty 1

## 2018-08-22 NOTE — ED Notes (Signed)
IV team at bedside 

## 2018-08-22 NOTE — ED Provider Notes (Signed)
[facility] EMERGENCY DEPARTMENT Provider Note   CSN: [id number] Arrival date & time: 08/22/18  1259     History   Chief Complaint No chief complaint on file.   HPI [name] is a 57 y.o. female.  The history is provided by the patient and medical records. No language interpreter was used.     57 year old female with history of Sjogren's syndrome, colon cancer, non-smoker presenting for evaluation of chest pain. Patient report for the past 5 days she has had intermittent midsternal chest pain.  She described pain as a sharp stabbing pain affecting more on the right side of her chest with occasional shortness of breath, lightheadedness.  Pain is worsened with taking deep breath when she pushed on her chest.  She noticed more pain when she lays down.  Pain intensified today with lightheadedness prompting her to come to ER.  Currently rates pain as 9 out of 10.  She denies any specific treatment tried.  She denies any associated fever, chills, diaphoresis, recent illness, recent injury, trauma, nausea vomiting diarrhea abdominal pain or back pain.  She denies any rash.  She does not have any significant cardiac history and no prior history of PE DVT, no recent surgery, prolonged bedrest, but does report history of colon cancer currently in remission.  Last chemo treatment was last year.  She is not a smoker or drinker he denies any significant family history of early cardiac death.        Past Medical History:  Diagnosis Date  . Colon cancer ([place])    finished chemo feb 2009  . Helicobacter pylori gastritis 12/23/08  . Hyperplastic colon polyp 12/23/08  . Iron deficiency anemia   . Sjoegren syndrome     Patient Active Problem List   Diagnosis Date Noted  . Nonspecific abnormal results of liver function study 09/10/2012  . H/O colon cancer, stage III 12/29/2011  . Abdominal pain, epigastric 12/29/2011    Past Surgical History:  Procedure Laterality Date    . BREAST REDUCTION SURGERY    . DILATION AND CURETTAGE OF UTERUS     x 7  . HEMICOLECTOMY     2008     OB History   None      Home Medications    Prior to Admission medications   Medication Sig Start Date End Date Taking? Authorizing Provider  amLODipine (NORVASC) 10 MG tablet Take 10 mg by mouth daily.      [provider]  cholecalciferol (VITAMIN D) 1000 UNITS tablet Take 1,000 Units by mouth daily.      [provider]  ferrous sulfate 325 (65 FE) MG tablet Take 325 mg by mouth 2 (two) times daily.      [provider]  mycophenolate (CELLCEPT) 500 MG tablet Take 500 mg by mouth 2 (two) times daily.      [provider]  predniSONE (DELTASONE) 20 MG tablet Two daily with food 04/04/18   Robyn Haber, MD  traMADol (ULTRAM) 50 MG tablet Take 50 mg by mouth 3 (three) times daily as needed. For pain. Maximum dose= 8 tablets per day     [provider]    Family History Family History  Problem Relation Age of Onset  . Anemia Mother   . Diabetes Mother   . Kidney disease Mother   . Colon cancer Neg Hx     Social History Social History   Tobacco Use  . Smoking status: Never Smoker  .  Smokeless tobacco: Never Used  Substance Use Topics  . Alcohol use: No  . Drug use: No     Allergies   Venofer [ferric oxide] and Dilaudid [hydromorphone hcl]   Review of Systems Review of Systems  All other systems reviewed and are negative.    Physical Exam Updated Vital Signs There were no vitals taken for this visit.  Physical Exam  Constitutional: She appears well-developed and well-nourished. No distress.  HENT:  Head: Atraumatic.  Eyes: Conjunctivae are normal.  Neck: Neck supple.  Cardiovascular: Normal rate, regular rhythm and normal pulses.  Pulmonary/Chest: Effort normal and breath sounds normal. No accessory muscle usage. No respiratory distress. She has no decreased breath sounds. She has no wheezes. She has no  rhonchi. She has no rales. She exhibits tenderness (tenderness to palpation and R side chest wall without crepitus or emphysema.  no rash).  Abdominal: Soft. She exhibits no distension. There is no tenderness.  Musculoskeletal:       Right lower leg: She exhibits no edema.       Left lower leg: She exhibits no edema.  Neurological: She is alert.  Skin: No rash noted.  Psychiatric: She has a normal mood and affect.  Nursing note and vitals reviewed.    ED Treatments / Results  Labs (all labs ordered are listed, but only abnormal results are displayed) Labs Reviewed  CBC - Abnormal; Notable for the following components:      Result Value   MCH 25.4 (*)    RDW 16.5 (*)    All other components within normal limits  BASIC METABOLIC PANEL  BRAIN NATRIURETIC PEPTIDE  D-DIMER, QUANTITATIVE (NOT AT [facility])  I-STAT TROPONIN, ED  I-STAT TROPONIN, ED    EKG EKG Interpretation  Date/Time:  Wednesday August 22 2018 13:08:11 EST Ventricular Rate:  70 PR Interval:    QRS Duration: 115 QT Interval:  449 QTC Calculation: 485 R Axis:   -25 Text Interpretation:  Sinus rhythm Incomplete RBBB and LAFB Low voltage, precordial leads nonspecific T wave flattening  Confirmed by Sherwood Gambler [phone]) on 08/22/2018 1:36:32 PM Also confirmed by Sherwood Gambler [phone]), editor Philomena Doheny [phone])  on 08/22/2018 3:24:26 PM   Radiology Dg Chest Port 1 View  Result Date: 08/22/2018 CLINICAL DATA:  Shortness of breath and chest pain EXAM: PORTABLE CHEST 1 VIEW COMPARISON:  January 22, 2013 FINDINGS: Lungs are clear. Heart is mildly enlarged with pulmonary vascularity normal. No adenopathy. No bone lesions. No pneumothorax. IMPRESSION: Mild cardiac enlargement.  No edema or consolidation. Electronically Signed   By: Lowella Grip III M.D.   On: 08/22/2018 13:29    Procedures Procedures (including critical care time)  Medications Ordered in ED Medications  morphine 4 MG/ML injection 4 mg (4 mg  Intravenous Given 08/22/18 1504)  ondansetron (ZOFRAN) injection 4 mg (4 mg Intravenous Given 08/22/18 1502)  ketorolac (TORADOL) 30 MG/ML injection 30 mg (30 mg Intravenous Given 08/22/18 1634)     Initial Impression / Assessment and Plan / ED Course  I have reviewed the triage vital signs and the nursing notes.  Pertinent labs & imaging results that were available during my care of the patient were reviewed by me and considered in my medical decision making (see chart for details).     BP 133/84   Pulse (!) 58   Temp 98.3 F (36.8 C) (Oral)   Resp 13   Ht 5\' 10"  (1.778 m)   Wt 87.5 kg   SpO2 98%  BMI 27.69 kg/m    Final Clinical Impressions(s) / ED Diagnoses   Final diagnoses:  Chest wall pain    ED Discharge Orders    None     1:40 PM Pt here with pleuritic cp, sob, and reproducible chest wall pain.  Hx of CA, therefore increase risk of PE, d-dimer ordered, work up initiated.   4:11 PM Initial EKG shows incomplete right bundle branch block and left anterior fascicular block and some nonspecific T wave flattening however no other concerning changes were noted.  Initial troponin is negative, labs are reassuring, normal H&H, normal WBC, normal BNP as well as normal d-dimer.  Chest x-ray shows mild cardiomegaly but no other concerning feature.  Patient still endorsing chest wall discomfort improves when she sits up.  Symptoms may suggest costochondritis or potential pericarditis however EKG did not shows diffuse ST elevation.  Patient has a heart score of 2, low risk of MACE.    5:30 PM Normal serial troponin.  The remainder of labs are reassuring.  Encourage pt to f/u closely with PCP for further management.  Return precaution discussed.    Domenic Moras, PA-C 08/22/18 1731    Sherwood Gambler, MD 08/23/18 [phone]

## 2018-08-22 NOTE — Discharge Instructions (Signed)
Please call and follow up closely with your doctor for further evaluation of your chest pain. Take ibuprofen as needed.  Return if you develop worsening shortness of breath, productive cough, persistent chest pain or if you have any concerns.

## 2018-08-22 NOTE — ED Triage Notes (Signed)
Pt reports CP x 4 days, went to urgent care today (5th day) and was sent here. Pt has some SOB and dizziness.

## 2018-08-22 NOTE — ED Notes (Signed)
IV team leaving room. This RN will administer meds.

## 2018-08-22 NOTE — ED Triage Notes (Signed)
Pt c/o ongoing chest pain x4 days with SOB, ekg obtained and given to Dr. Mannie Stabile, per MD pt needs eval in ER, pt agreeable to plan, husband will take her there.

## 2018-09-13 MED FILL — traMADol HCL 50 MG TABS: 50 | 30 days supply | Qty: 90 | Fill #0

## 2018-09-25 ENCOUNTER — Encounter (HOSPITAL_COMMUNITY): Payer: Self-pay | Admitting: Emergency Medicine

## 2018-09-25 ENCOUNTER — Emergency Department (HOSPITAL_COMMUNITY)
Admission: EM | Admit: 2018-09-25 | Discharge: 2018-09-25 | Disposition: A | Discharge status: Home / Self Care | Payer: PRIVATE HEALTH INSURANCE | Attending: Emergency Medicine | Admitting: Emergency Medicine

## 2018-09-25 ENCOUNTER — Emergency Department (HOSPITAL_COMMUNITY): Payer: PRIVATE HEALTH INSURANCE

## 2018-09-25 DIAGNOSIS — M25552 Pain in left hip: Secondary | ICD-10-CM | POA: Insufficient documentation

## 2018-09-25 DIAGNOSIS — W19XXXA Unspecified fall, initial encounter: Secondary | ICD-10-CM

## 2018-09-25 DIAGNOSIS — Z79899 Other long term (current) drug therapy: Secondary | ICD-10-CM | POA: Insufficient documentation

## 2018-09-25 MED ORDER — IBUPROFEN 400 MG PO TABS
600.0000 mg | ORAL_TABLET | Freq: Once | ORAL | Status: AC
  Administered 2018-09-25: 600 mg via ORAL
  Filled 2018-09-25: qty 1

## 2018-09-25 NOTE — ED Provider Notes (Signed)
[facility] Provider Note   CSN: [id number] Arrival date & time: 09/25/18  7416     History   Chief Complaint Chief Complaint  Patient presents with  . Fall    [name] is a 57 y.o. female.  Sara   57 year old female presents status post fall.  Patient notes that she was going into patient's room and there was water on the floor.  This caused her to slip fall and landed on her left buttocks and hip.  She notes pain over the gluteal region and proximal femur.  She denies any significant neck back or right-sided hip pain.  Minor pain to the anterior aspect of her knee.  She is ambulatory.  No medications prior to arrival.  Past Medical History:  Diagnosis Date  . Colon cancer ([place])    finished chemo feb 2009  . Helicobacter pylori gastritis 12/23/08  . Hyperplastic colon polyp 12/23/08  . Iron deficiency anemia   . Sjoegren syndrome     Patient Active Problem List   Diagnosis Date Noted  . Nonspecific abnormal results of liver function study 09/10/2012  . H/O colon cancer, stage III 12/29/2011  . Abdominal pain, epigastric 12/29/2011    Past Surgical History:  Procedure Laterality Date  . BREAST REDUCTION SURGERY    . DILATION AND CURETTAGE OF UTERUS     x 7  . HEMICOLECTOMY     2008     OB History   No obstetric history on file.      Home Medications    Prior to Admission medications   Medication Sig Start Date End Date Taking? Authorizing Provider  amLODipine (NORVASC) 10 MG tablet Take 10 mg by mouth daily.      [provider]  IRON PO Take 10 mLs by mouth 2 (two) times daily. Organic iron liquid    [provider]  Multiple Vitamins-Minerals (HAIR SKIN AND NAILS FORMULA PO) Take 1 tablet by mouth daily.    [provider]  mycophenolate (CELLCEPT) 500 MG tablet Take 500 mg by mouth 2 (two) times daily.      [provider]  predniSONE (DELTASONE) 20 MG tablet Two daily  with food Patient not taking: Reported on 08/22/2018 04/04/18   Robyn Haber, MD  traMADol (ULTRAM) 50 MG tablet Take 50 mg by mouth 3 (three) times daily. For pain. Maximum dose= 8 tablets per day     [provider]  Vitamin D, Ergocalciferol, (DRISDOL) 1.25 MG (50000 UT) CAPS capsule Take 50,000 Units by mouth once a week. 03/28/18   [provider]    Family History Family History  Problem Relation Age of Onset  . Anemia Mother   . Diabetes Mother   . Kidney disease Mother   . Colon cancer Neg Hx     Social History Social History   Tobacco Use  . Smoking status: Never Smoker  . Smokeless tobacco: Never Used  Substance Use Topics  . Alcohol use: No  . Drug use: No     Allergies   Venofer [ferric oxide]; Dilaudid [hydromorphone hcl]; and Lorazepam   Review of Systems Review of Systems  All other systems reviewed and are negative.    Physical Exam Updated Vital Signs BP (!) 129/96 (BP Location: Right Arm)   Pulse 64   Temp 97.9 F (36.6 C) (Oral)   Resp 19   SpO2 100%   Physical Exam Vitals signs and nursing note reviewed.  Constitutional:      Appearance: She is well-developed.  HENT:     Head: Normocephalic and atraumatic.  Eyes:     General: No scleral icterus.       Right eye: No discharge.        Left eye: No discharge.     Conjunctiva/sclera: Conjunctivae normal.     Pupils: Pupils are equal, round, and reactive to light.  Neck:     Musculoskeletal: Normal range of motion.     Vascular: No JVD.     Trachea: No tracheal deviation.  Pulmonary:     Effort: Pulmonary effort is normal.     Breath sounds: No stridor.  Musculoskeletal:     Comments: Tenderness palpation of the left posterior gluteus, no significant pain with lateral hip compression, minor pain to palpation of the anterior knee no joint swelling, no bruising, no neurological deficits-no midline back pain  Neurological:     Mental Status: She is alert and oriented  to person, place, and time.     Coordination: Coordination normal.  Psychiatric:        Behavior: Behavior normal.        Thought Content: Thought content normal.        Judgment: Judgment normal.      ED Treatments / Results  Labs (all labs ordered are listed, but only abnormal results are displayed) Labs Reviewed - No data to display  EKG None  Radiology Dg Knee 2 Views Left  Result Date: 09/25/2018 CLINICAL DATA:  Fall today, anterior knee pain. EXAM: LEFT KNEE - 1-2 VIEW COMPARISON:  11/30/2010 FINDINGS: Tricompartmental spurring with suspected medial compartmental articular space narrowing compatible with osteoarthritis. No knee effusion or discrete fracture identified. However, there is a 6 by 4 mm well corticated oval-shaped ossific structure anteriorly along the tibial spine which may represent chronically fragmented spur, not readily apparent on the prior exam from 2012. IMPRESSION: 1. Moderate osteoarthritis. 2. No knee effusion or fracture is identified. 3. Potential 6 mm chronically fragmented spur or free osteochondral fragment anteriorly along the tibial spine. Electronically Signed   By: Van Clines M.D.   On: 09/25/2018 07:52   Dg Hip Unilat W Or Wo Pelvis 2-3 Views Left  Result Date: 09/25/2018 CLINICAL DATA:  Fall today, pain along the buttocks extending to the mid femur. EXAM: DG HIP (WITH OR WITHOUT PELVIS) 2-3V LEFT COMPARISON:  None. FINDINGS: Mild bilateral axial loss of articular space in both hips. Lower lumbar intervertebral disc space narrowing. No bony pelvic fracture is appreciated. No left hip fracture is observed. Left paramedian anatomic pelvic calcification corresponding to previous calcified uterine fibroid on CT. IMPRESSION: 1. Mild degenerative axial loss of articular space in both hips. 2. No acute bony findings involving the pelvis or left hip. 3. Calcified left uterine fibroid. Electronically Signed   By: Van Clines M.D.   On:  09/25/2018 07:56    Procedures Procedures (including critical care time)  Medications Ordered in ED Medications  ibuprofen (ADVIL,MOTRIN) tablet 600 mg (600 mg Oral Given 09/25/18 0756)     Initial Impression / Assessment and Plan / ED Course  I have reviewed the triage vital signs and the nursing notes.  Pertinent labs & imaging results that were available during my care of the patient were reviewed by me and considered in my medical decision making (see chart for details).     57 year old female status post fall.  No bony abnormality likely soft tissue injury.  She is  encouraged to use ibuprofen and strict return precautions given.  She verbalized understanding and agreement to today's plan.  Final Clinical Impressions(s) / ED Diagnoses   Final diagnoses:  Fall, initial encounter  Pain of left hip joint    ED Discharge Orders    None       Francee Gentile 09/25/18 Rachel, Wenda Overland, MD 09/26/18 [phone]

## 2018-09-25 NOTE — Discharge Instructions (Addendum)
Please read attached information. If you experience any new or worsening signs or symptoms please return to the emergency room for evaluation. Please follow-up with your primary care provider or specialist as discussed. Please use tylenol as needed for pain.

## 2018-09-25 NOTE — ED Triage Notes (Signed)
Pt is an employee at the hospital , she fell in a pt.'s room this morning when she slipped on water, no loc . Pt is c/o left hip and knee pain

## 2018-09-27 MED FILL — CYCLOBENZAPRINE 10 MG TAB: 10 | 30 days supply | Qty: 30 | Fill #0

## 2018-10-05 ENCOUNTER — Other Ambulatory Visit (HOSPITAL_COMMUNITY): Payer: Self-pay | Admitting: Family Medicine

## 2018-10-05 ENCOUNTER — Ambulatory Visit (HOSPITAL_COMMUNITY)
Admission: RE | Admit: 2018-10-05 | Discharge: 2018-10-05 | Disposition: A | Discharge status: Home / Self Care | Payer: 59 | Source: Ambulatory Visit | Attending: Family Medicine | Admitting: Family Medicine

## 2018-10-05 DIAGNOSIS — R52 Pain, unspecified: Secondary | ICD-10-CM | POA: Diagnosis not present

## 2018-10-05 DIAGNOSIS — S4992XA Unspecified injury of left shoulder and upper arm, initial encounter: Secondary | ICD-10-CM | POA: Diagnosis not present

## 2018-10-05 DIAGNOSIS — M25512 Pain in left shoulder: Secondary | ICD-10-CM | POA: Diagnosis not present

## 2018-11-13 DIAGNOSIS — R7303 Prediabetes: Secondary | ICD-10-CM | POA: Diagnosis not present

## 2018-11-20 MED FILL — traMADol HCL 50 MG TABS: 50 | 30 days supply | Qty: 90 | Fill #0

## 2018-12-03 MED FILL — AMLODIPINE BESYLATE 10 MG T: 10 | 90 days supply | Qty: 90 | Fill #1 | Status: TO

## 2019-01-21 MED FILL — traMADol HCL 50 MG TABS: 50 | 30 days supply | Qty: 90 | Fill #0

## 2019-01-24 DIAGNOSIS — E559 Vitamin D deficiency, unspecified: Secondary | ICD-10-CM | POA: Diagnosis not present

## 2019-01-24 DIAGNOSIS — G894 Chronic pain syndrome: Secondary | ICD-10-CM | POA: Diagnosis not present

## 2019-01-24 DIAGNOSIS — E663 Overweight: Secondary | ICD-10-CM | POA: Diagnosis not present

## 2019-01-24 DIAGNOSIS — I10 Essential (primary) hypertension: Secondary | ICD-10-CM | POA: Diagnosis not present

## 2019-01-25 DIAGNOSIS — I10 Essential (primary) hypertension: Secondary | ICD-10-CM | POA: Diagnosis not present

## 2019-01-25 DIAGNOSIS — E559 Vitamin D deficiency, unspecified: Secondary | ICD-10-CM | POA: Diagnosis not present

## 2019-02-13 MED FILL — AMLODIPINE BESYLATE 10 MG T: 10 | 90 days supply | Qty: 90 | Fill #0

## 2019-03-27 DIAGNOSIS — M3500 Sicca syndrome, unspecified: Secondary | ICD-10-CM | POA: Diagnosis not present

## 2019-03-27 DIAGNOSIS — M2550 Pain in unspecified joint: Secondary | ICD-10-CM | POA: Diagnosis not present

## 2019-03-27 DIAGNOSIS — G629 Polyneuropathy, unspecified: Secondary | ICD-10-CM | POA: Diagnosis not present

## 2019-03-27 DIAGNOSIS — Z6830 Body mass index (BMI) 30.0-30.9, adult: Secondary | ICD-10-CM | POA: Diagnosis not present

## 2019-03-27 DIAGNOSIS — R5383 Other fatigue: Secondary | ICD-10-CM | POA: Diagnosis not present

## 2019-03-27 DIAGNOSIS — M791 Myalgia, unspecified site: Secondary | ICD-10-CM | POA: Diagnosis not present

## 2019-03-27 DIAGNOSIS — E669 Obesity, unspecified: Secondary | ICD-10-CM | POA: Diagnosis not present

## 2019-03-27 DIAGNOSIS — M150 Primary generalized (osteo)arthritis: Secondary | ICD-10-CM | POA: Diagnosis not present

## 2019-03-27 MED FILL — CELECOXIB 200 MG CAP: 200 | 30 days supply | Qty: 60 | Fill #0

## 2019-03-27 MED FILL — MYCOPHENOLATE 500 MG TABLET: 500 | 30 days supply | Qty: 60 | Fill #0

## 2019-04-09 DIAGNOSIS — M25561 Pain in right knee: Secondary | ICD-10-CM | POA: Diagnosis not present

## 2019-04-09 DIAGNOSIS — M170 Bilateral primary osteoarthritis of knee: Secondary | ICD-10-CM | POA: Diagnosis not present

## 2019-04-26 ENCOUNTER — Emergency Department (HOSPITAL_COMMUNITY)
Admission: EM | Admit: 2019-04-26 | Discharge: 2019-04-26 | Disposition: A | Discharge status: Home / Self Care | Payer: PRIVATE HEALTH INSURANCE | Attending: Emergency Medicine | Admitting: Emergency Medicine

## 2019-04-26 ENCOUNTER — Other Ambulatory Visit: Payer: Self-pay

## 2019-04-26 ENCOUNTER — Emergency Department (HOSPITAL_COMMUNITY): Payer: PRIVATE HEALTH INSURANCE

## 2019-04-26 DIAGNOSIS — S40011A Contusion of right shoulder, initial encounter: Secondary | ICD-10-CM

## 2019-04-26 DIAGNOSIS — Y9389 Activity, other specified: Secondary | ICD-10-CM | POA: Insufficient documentation

## 2019-04-26 DIAGNOSIS — W19XXXA Unspecified fall, initial encounter: Secondary | ICD-10-CM

## 2019-04-26 DIAGNOSIS — Y92239 Unspecified place in hospital as the place of occurrence of the external cause: Secondary | ICD-10-CM | POA: Insufficient documentation

## 2019-04-26 DIAGNOSIS — S4991XA Unspecified injury of right shoulder and upper arm, initial encounter: Secondary | ICD-10-CM | POA: Diagnosis present

## 2019-04-26 DIAGNOSIS — W1830XA Fall on same level, unspecified, initial encounter: Secondary | ICD-10-CM | POA: Insufficient documentation

## 2019-04-26 DIAGNOSIS — Z85038 Personal history of other malignant neoplasm of large intestine: Secondary | ICD-10-CM | POA: Diagnosis not present

## 2019-04-26 DIAGNOSIS — Y990 Civilian activity done for income or pay: Secondary | ICD-10-CM | POA: Diagnosis not present

## 2019-04-26 DIAGNOSIS — Z79899 Other long term (current) drug therapy: Secondary | ICD-10-CM | POA: Diagnosis not present

## 2019-04-26 MED ORDER — METHOCARBAMOL 500 MG PO TABS: 500.0000 mg | ORAL_TABLET | Freq: Two times a day (BID) | ORAL | 0 refills | Status: DC

## 2019-04-26 MED FILL — METHOCARBAMOL 500 MG TABS: 500 | 10 days supply | Qty: 20 | Fill #0

## 2019-04-26 NOTE — ED Provider Notes (Signed)
[facility] EMERGENCY DEPARTMENT Provider Note   CSN: [id number] Arrival date & time: 04/26/19  0147     History   Chief Complaint Chief Complaint  Patient presents with  . Fall  . Shoulder Pain    HPI [name] is a 58 y.o. female.    The history is provided by the patient. No language interpreter was used.  Shoulder Pain Location:  Shoulder Shoulder location:  R shoulder Injury: yes   Mechanism of injury: fall   Fall:    Fall occurred: fall from standing.   Impact surface: tiled floor.   Point of impact: R side/shoulder.   Entrapped after fall: no   Pain details:    Quality:  Aching   Radiates to:  Does not radiate   Severity:  Moderate   Onset quality:  Sudden   Timing:  Constant   Progression:  Unchanged Dislocation: no   Prior injury to area:  No Relieved by:  Ice Exacerbated by: palpation and movement. Ineffective treatments:  NSAIDs Associated symptoms: no decreased range of motion   Risk factors: no known bone disorder     Past Medical History:  Diagnosis Date  . Colon cancer ([place])    finished chemo feb 2009  . Helicobacter pylori gastritis 12/23/08  . Hyperplastic colon polyp 12/23/08  . Iron deficiency anemia   . Sjoegren syndrome     Patient Active Problem List   Diagnosis Date Noted  . Nonspecific abnormal results of liver function study 09/10/2012  . H/O colon cancer, stage III 12/29/2011  . Abdominal pain, epigastric 12/29/2011    Past Surgical History:  Procedure Laterality Date  . BREAST REDUCTION SURGERY    . DILATION AND CURETTAGE OF UTERUS     x 7  . HEMICOLECTOMY     2008     OB History   No obstetric history on file.      Home Medications    Prior to Admission medications   Medication Sig Start Date End Date Taking? Authorizing Provider  amLODipine (NORVASC) 10 MG tablet Take 10 mg by mouth daily.      [provider]  IRON PO Take 10 mLs by mouth 2 (two) times daily. Organic iron  liquid    [provider]  methocarbamol (ROBAXIN) 500 MG tablet Take 1 tablet (500 mg total) by mouth 2 (two) times daily. 04/26/19   Antonietta Breach, PA-C  Multiple Vitamins-Minerals (HAIR SKIN AND NAILS FORMULA PO) Take 1 tablet by mouth daily.    [provider]  mycophenolate (CELLCEPT) 500 MG tablet Take 500 mg by mouth 2 (two) times daily.      [provider]  predniSONE (DELTASONE) 20 MG tablet Two daily with food Patient not taking: Reported on 08/22/2018 04/04/18   Robyn Haber, MD  traMADol (ULTRAM) 50 MG tablet Take 50 mg by mouth 3 (three) times daily. For pain. Maximum dose= 8 tablets per day     [provider]  Vitamin D, Ergocalciferol, (DRISDOL) 1.25 MG (50000 UT) CAPS capsule Take 50,000 Units by mouth once a week. 03/28/18   [provider]    Family History Family History  Problem Relation Age of Onset  . Anemia Mother   . Diabetes Mother   . Kidney disease Mother   . Colon cancer Neg Hx     Social History Social History   Tobacco Use  . Smoking status: Never Smoker  . Smokeless tobacco: Never Used  Substance Use  Topics  . Alcohol use: No  . Drug use: No     Allergies   Venofer [ferric oxide], Dilaudid [hydromorphone hcl], and Lorazepam   Review of Systems Review of Systems Ten systems reviewed and are negative for acute change, except as noted in the HPI.    Physical Exam Updated Vital Signs BP (!) 155/104 (BP Location: Right Arm)   Pulse 85   Temp 98.3 F (36.8 C) (Oral)   Resp 16   SpO2 100%   Physical Exam Vitals signs and nursing note reviewed.  Constitutional:      General: She is not in acute distress.    Appearance: She is well-developed. She is not diaphoretic.     Comments: Nontoxic appearing and in NAD  HENT:     Head: Normocephalic and atraumatic.  Eyes:     General: No scleral icterus.    Conjunctiva/sclera: Conjunctivae normal.  Neck:     Musculoskeletal: Normal range of motion.   Pulmonary:     Effort: Pulmonary effort is normal. No respiratory distress.     Comments: Respirations even and unlabored Musculoskeletal: Normal range of motion.     Comments: TTP to the anterior R shoulder joint without bony deformity or crepitus. Preserved ROM of the RUE.  Skin:    General: Skin is warm and dry.     Coloration: Skin is not pale.     Findings: No erythema or rash.  Neurological:     General: No focal deficit present.     Mental Status: She is alert and oriented to person, place, and time.     Coordination: Coordination normal.     Comments: Sensation to light touch intact in the RUE. 5/5 grip strength in the R hand.  Psychiatric:        Behavior: Behavior normal.      ED Treatments / Results  Labs (all labs ordered are listed, but only abnormal results are displayed) Labs Reviewed - No data to display  EKG None  Radiology Dg Shoulder Right  Result Date: 04/26/2019 CLINICAL DATA:  58 year old female status post fall on stairs. EXAM: RIGHT SHOULDER - 2+ VIEW COMPARISON:  Chest radiographs 08/22/2018 and earlier. FINDINGS: No glenohumeral joint dislocation. Proximal right humerus appears intact. Chronic glenoid degenerative spurring. Mild degenerative changes at the right [facility] joint. The right scapula and visible clavicle appear intact. Stable and negative visible right chest. IMPRESSION: No acute fracture or dislocation identified about the right shoulder. Electronically Signed   By: Genevie Ann M.D.   On: 04/26/2019 02:57    Procedures Procedures (including critical care time)  Medications Ordered in ED Medications - No data to display   Initial Impression / Assessment and Plan / ED Course  I have reviewed the triage vital signs and the nursing notes.  Pertinent labs & imaging results that were available during my care of the patient were reviewed by me and considered in my medical decision making (see chart for details).        Patient presents to the  emergency department for evaluation of R shoulder pain. Patient neurovascularly intact on exam. Imaging negative for fracture, dislocation, bony deformity. No swelling, erythema, heat to touch to the affected area; no concern for septic joint. Compartments in the affected extremity are soft. Plan for supportive management including RICE and NSAIDs; primary care follow up as needed. Return precautions discussed and provided. Patient discharged in stable condition with no unaddressed concerns.   Final Clinical Impressions(s) / ED Diagnoses  Final diagnoses:  Contusion of right shoulder, initial encounter  Fall, initial encounter    ED Discharge Orders         Ordered    methocarbamol (ROBAXIN) 500 MG tablet  2 times daily     04/26/19 0319           Antonietta Breach, PA-C 04/26/19 7276    Merryl Hacker, MD 04/27/19 [phone]

## 2019-04-26 NOTE — ED Notes (Signed)
Pt given 2 ice packs for rt shoulder

## 2019-04-26 NOTE — ED Triage Notes (Signed)
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 Fell on her right shoulder and hit her right side of her face. No LOC Did not hit her head.

## 2019-04-26 NOTE — Discharge Instructions (Addendum)
Alternate ice and heat to areas of injury 3-4 times per day to limit inflammation and spasm.  Continue with frequent stretching to prevent spasm of your rotator cuff.  We recommend consistent use of 600mg  ibuprofen every 6 hours in addition to Robaxin as needed for muscle spasms.  Do not drive or drink alcohol after taking Robaxin as it may make you drowsy and impair your judgment.  We recommend follow-up with a primary care doctor to ensure resolution of symptoms.  Return to the ED for any new or concerning symptoms.

## 2019-04-30 DIAGNOSIS — G629 Polyneuropathy, unspecified: Secondary | ICD-10-CM | POA: Diagnosis not present

## 2019-04-30 DIAGNOSIS — Z79899 Other long term (current) drug therapy: Secondary | ICD-10-CM | POA: Diagnosis not present

## 2019-04-30 DIAGNOSIS — M150 Primary generalized (osteo)arthritis: Secondary | ICD-10-CM | POA: Diagnosis not present

## 2019-04-30 DIAGNOSIS — K760 Fatty (change of) liver, not elsewhere classified: Secondary | ICD-10-CM | POA: Diagnosis not present

## 2019-04-30 DIAGNOSIS — M2550 Pain in unspecified joint: Secondary | ICD-10-CM | POA: Diagnosis not present

## 2019-04-30 DIAGNOSIS — M3500 Sicca syndrome, unspecified: Secondary | ICD-10-CM | POA: Diagnosis not present

## 2019-05-13 MED FILL — AMLODIPINE BESYLATE 10 MG T: 10 | 90 days supply | Qty: 90 | Fill #0

## 2019-05-13 MED FILL — traMADol HCL 50 MG TABS: 50 | 30 days supply | Qty: 90 | Fill #0

## 2019-06-19 ENCOUNTER — Other Ambulatory Visit: Payer: Self-pay | Admitting: Geriatric Medicine

## 2019-06-19 DIAGNOSIS — R1011 Right upper quadrant pain: Secondary | ICD-10-CM

## 2019-06-19 DIAGNOSIS — R718 Other abnormality of red blood cells: Secondary | ICD-10-CM | POA: Diagnosis not present

## 2019-06-19 DIAGNOSIS — R739 Hyperglycemia, unspecified: Secondary | ICD-10-CM | POA: Diagnosis not present

## 2019-06-20 ENCOUNTER — Ambulatory Visit
Admission: RE | Admit: 2019-06-20 | Discharge: 2019-06-20 | Disposition: A | Discharge status: Home / Self Care | Payer: 59 | Source: Ambulatory Visit | Attending: Geriatric Medicine | Admitting: Geriatric Medicine

## 2019-06-20 DIAGNOSIS — K760 Fatty (change of) liver, not elsewhere classified: Secondary | ICD-10-CM | POA: Diagnosis not present

## 2019-06-20 DIAGNOSIS — R1011 Right upper quadrant pain: Secondary | ICD-10-CM

## 2019-06-20 DIAGNOSIS — K838 Other specified diseases of biliary tract: Secondary | ICD-10-CM | POA: Diagnosis not present

## 2019-06-21 ENCOUNTER — Other Ambulatory Visit: Payer: Self-pay | Admitting: Geriatric Medicine

## 2019-06-21 DIAGNOSIS — K838 Other specified diseases of biliary tract: Secondary | ICD-10-CM

## 2019-07-17 DIAGNOSIS — Z23 Encounter for immunization: Secondary | ICD-10-CM | POA: Diagnosis not present

## 2019-07-17 DIAGNOSIS — E559 Vitamin D deficiency, unspecified: Secondary | ICD-10-CM | POA: Diagnosis not present

## 2019-07-17 DIAGNOSIS — G894 Chronic pain syndrome: Secondary | ICD-10-CM | POA: Diagnosis not present

## 2019-07-17 DIAGNOSIS — Z85038 Personal history of other malignant neoplasm of large intestine: Secondary | ICD-10-CM | POA: Diagnosis not present

## 2019-07-17 DIAGNOSIS — R0789 Other chest pain: Secondary | ICD-10-CM | POA: Diagnosis not present

## 2019-07-17 DIAGNOSIS — I10 Essential (primary) hypertension: Secondary | ICD-10-CM | POA: Diagnosis not present

## 2019-07-17 DIAGNOSIS — M3500 Sicca syndrome, unspecified: Secondary | ICD-10-CM | POA: Diagnosis not present

## 2019-07-17 DIAGNOSIS — Z0000 Encounter for general adult medical examination without abnormal findings: Secondary | ICD-10-CM | POA: Diagnosis not present

## 2019-07-17 DIAGNOSIS — E663 Overweight: Secondary | ICD-10-CM | POA: Diagnosis not present

## 2019-07-18 ENCOUNTER — Ambulatory Visit
Admission: RE | Admit: 2019-07-18 | Discharge: 2019-07-18 | Disposition: A | Discharge status: Home / Self Care | Payer: 59 | Source: Ambulatory Visit | Attending: Geriatric Medicine | Admitting: Geriatric Medicine

## 2019-07-18 DIAGNOSIS — K838 Other specified diseases of biliary tract: Secondary | ICD-10-CM

## 2019-07-18 MED ORDER — GADOBENATE DIMEGLUMINE 529 MG/ML IV SOLN
19.0000 mL | Freq: Once | INTRAVENOUS | Status: AC | PRN
  Administered 2019-07-18: 19 mL via INTRAVENOUS

## 2019-07-19 ENCOUNTER — Other Ambulatory Visit: Payer: Self-pay | Admitting: Internal Medicine

## 2019-07-19 DIAGNOSIS — Z13220 Encounter for screening for lipoid disorders: Secondary | ICD-10-CM | POA: Diagnosis not present

## 2019-07-19 DIAGNOSIS — Z1231 Encounter for screening mammogram for malignant neoplasm of breast: Secondary | ICD-10-CM

## 2019-07-19 DIAGNOSIS — Z0000 Encounter for general adult medical examination without abnormal findings: Secondary | ICD-10-CM | POA: Diagnosis not present

## 2019-07-19 DIAGNOSIS — R739 Hyperglycemia, unspecified: Secondary | ICD-10-CM | POA: Diagnosis not present

## 2019-07-22 MED FILL — traMADol HCL 50 MG TABS: 50 | 30 days supply | Qty: 90 | Fill #0

## 2019-07-24 ENCOUNTER — Other Ambulatory Visit: Payer: Self-pay

## 2019-07-24 ENCOUNTER — Ambulatory Visit
Admission: RE | Admit: 2019-07-24 | Discharge: 2019-07-24 | Disposition: A | Discharge status: Home / Self Care | Payer: 59 | Source: Ambulatory Visit | Attending: Internal Medicine | Admitting: Internal Medicine

## 2019-07-24 DIAGNOSIS — Z1231 Encounter for screening mammogram for malignant neoplasm of breast: Secondary | ICD-10-CM

## 2019-07-26 MED FILL — AMLODIPINE BESYLATE 10 MG T: 10 | 90 days supply | Qty: 90 | Fill #0

## 2019-08-13 DIAGNOSIS — M25511 Pain in right shoulder: Secondary | ICD-10-CM | POA: Diagnosis not present

## 2019-08-13 DIAGNOSIS — M25561 Pain in right knee: Secondary | ICD-10-CM | POA: Diagnosis not present

## 2019-08-15 DIAGNOSIS — M2550 Pain in unspecified joint: Secondary | ICD-10-CM | POA: Diagnosis not present

## 2019-08-15 DIAGNOSIS — Z79899 Other long term (current) drug therapy: Secondary | ICD-10-CM | POA: Diagnosis not present

## 2019-08-15 DIAGNOSIS — G629 Polyneuropathy, unspecified: Secondary | ICD-10-CM | POA: Diagnosis not present

## 2019-08-15 DIAGNOSIS — M150 Primary generalized (osteo)arthritis: Secondary | ICD-10-CM | POA: Diagnosis not present

## 2019-08-15 DIAGNOSIS — R5383 Other fatigue: Secondary | ICD-10-CM | POA: Diagnosis not present

## 2019-08-15 DIAGNOSIS — M3500 Sicca syndrome, unspecified: Secondary | ICD-10-CM | POA: Diagnosis not present

## 2019-09-16 MED FILL — traMADol HCL 50 MG TABS: 50 | 30 days supply | Qty: 90 | Fill #1

## 2019-10-02 ENCOUNTER — Ambulatory Visit: Payer: 59 | Attending: Internal Medicine

## 2019-10-02 DIAGNOSIS — Z20828 Contact with and (suspected) exposure to other viral communicable diseases: Secondary | ICD-10-CM | POA: Diagnosis not present

## 2019-10-02 DIAGNOSIS — Z20822 Contact with and (suspected) exposure to covid-19: Secondary | ICD-10-CM

## 2019-10-03 LAB — NOVEL CORONAVIRUS, NAA: SARS-CoV-2, NAA: NOT DETECTED

## 2019-11-13 ENCOUNTER — Emergency Department (HOSPITAL_COMMUNITY)
Admission: EM | Admit: 2019-11-13 | Discharge: 2019-11-13 | Disposition: A | Discharge status: Home / Self Care | Payer: 59 | Attending: Emergency Medicine | Admitting: Emergency Medicine

## 2019-11-13 ENCOUNTER — Encounter (HOSPITAL_COMMUNITY): Payer: Self-pay | Admitting: Family Medicine

## 2019-11-13 ENCOUNTER — Emergency Department (HOSPITAL_COMMUNITY): Payer: 59

## 2019-11-13 ENCOUNTER — Other Ambulatory Visit: Payer: Self-pay

## 2019-11-13 DIAGNOSIS — R079 Chest pain, unspecified: Secondary | ICD-10-CM | POA: Diagnosis not present

## 2019-11-13 DIAGNOSIS — R0789 Other chest pain: Secondary | ICD-10-CM | POA: Insufficient documentation

## 2019-11-13 DIAGNOSIS — R0602 Shortness of breath: Secondary | ICD-10-CM | POA: Diagnosis not present

## 2019-11-13 HISTORY — DX: Endocarditis, valve unspecified: I38

## 2019-11-13 HISTORY — DX: Complete or unspecified spontaneous abortion without complication: O03.9

## 2019-11-13 HISTORY — DX: Cardiomegaly: I51.7

## 2019-11-13 LAB — BASIC METABOLIC PANEL
Anion gap: 8 (ref 5–15)
BUN: 13 mg/dL (ref 6–20)
CO2: 29 mmol/L (ref 22–32)
Calcium: 9.6 mg/dL (ref 8.9–10.3)
Chloride: 104 mmol/L (ref 98–111)
Creatinine, Ser: 0.99 mg/dL (ref 0.44–1.00)
GFR calc Af Amer: >60 mL/min (ref >60)
GFR calc non Af Amer: >60 mL/min (ref >60)
Glucose, Bld: 82 mg/dL (ref 70–99)
Potassium: 3.8 mmol/L (ref 3.5–5.1)
Sodium: 141 mmol/L (ref 135–145)

## 2019-11-13 LAB — CBC
HCT: 42.7 % (ref 36.0–46.0)
Hemoglobin: 13.9 g/dL (ref 12.0–15.0)
MCH: 26.4 pg (ref 26.0–34.0)
MCHC: 32.6 g/dL (ref 30.0–36.0)
MCV: 81 fL (ref 80.0–100.0)
Platelets: 286 10*3/uL (ref 150–400)
RBC: 5.27 MIL/uL — ABNORMAL HIGH (ref 3.87–5.11)
RDW: 15.9 % — ABNORMAL HIGH (ref 11.5–15.5)
WBC: 5.7 10*3/uL (ref 4.0–10.5)
nRBC: 0 % (ref 0.0–0.2)

## 2019-11-13 LAB — I-STAT BETA HCG BLOOD, ED (MC, WL, AP ONLY): I-stat hCG, quantitative: <5 m[IU]/mL (ref <5)

## 2019-11-13 LAB — TROPONIN I (HIGH SENSITIVITY)
Troponin I (High Sensitivity): 3 ng/L (ref <18)
Troponin I (High Sensitivity): 5 ng/L (ref <18)

## 2019-11-13 MED ORDER — KETOROLAC TROMETHAMINE 15 MG/ML IJ SOLN
15.0000 mg | Freq: Once | INTRAMUSCULAR | Status: DC
  Filled 2019-11-13: qty 1

## 2019-11-13 MED ORDER — MORPHINE SULFATE (PF) 4 MG/ML IV SOLN
4.0000 mg | Freq: Once | INTRAVENOUS | Status: AC
  Administered 2019-11-13: 4 mg via INTRAVENOUS
  Filled 2019-11-13: qty 1

## 2019-11-13 MED ORDER — IOHEXOL 350 MG/ML SOLN
100.0000 mL | Freq: Once | INTRAVENOUS | Status: AC | PRN
  Administered 2019-11-13: 100 mL via INTRAVENOUS

## 2019-11-13 MED ORDER — SODIUM CHLORIDE (PF) 0.9 % IJ SOLN
INTRAMUSCULAR | Status: AC
  Filled 2019-11-13: qty 50

## 2019-11-13 MED ORDER — NAPROXEN 500 MG PO TABS: 500.0000 mg | ORAL_TABLET | Freq: Two times a day (BID) | ORAL | 0 refills | Status: DC

## 2019-11-13 MED ORDER — FENTANYL CITRATE (PF) 100 MCG/2ML IJ SOLN
50.0000 ug | Freq: Once | INTRAMUSCULAR | Status: AC
  Administered 2019-11-13: 50 ug via INTRAVENOUS
  Filled 2019-11-13: qty 2

## 2019-11-13 MED ORDER — ONDANSETRON HCL 4 MG/2ML IJ SOLN
4.0000 mg | Freq: Once | INTRAMUSCULAR | Status: AC
  Administered 2019-11-13: 4 mg via INTRAVENOUS
  Filled 2019-11-13: qty 2

## 2019-11-13 MED ORDER — SODIUM CHLORIDE 0.9% FLUSH: 3.0000 mL | Freq: Once | INTRAVENOUS | Status: DC

## 2019-11-13 MED ORDER — KETOROLAC TROMETHAMINE 30 MG/ML IJ SOLN
15.0000 mg | Freq: Once | INTRAMUSCULAR | Status: AC
  Administered 2019-11-13: 15 mg via INTRAMUSCULAR

## 2019-11-13 MED FILL — NAPROXEN 500 MG TABLET: 500 | 15 days supply | Qty: 30 | Fill #0

## 2019-11-13 NOTE — ED Triage Notes (Signed)
Patient is complaining of mid-sternal chest pain that started "last Tuesday night". Pain is described has sharp and constant. Denies any radiation of pain, but reports she has a separate pain in her right shoulder. Associated symptoms of shortness of breath and nausea.

## 2019-11-13 NOTE — Discharge Instructions (Signed)
Please follow-up with your primary care provider in 2 days and cardiology within a week for continued evaluation.  Take naproxen as needed for pain.  Return to the emergency room immediately for new or worsening symptoms or concerns, such as return of chest pain, shortness of breath, fevers, vomiting or any concerns at all.

## 2019-11-13 NOTE — ED Provider Notes (Signed)
[facility] DEPT Provider Note   CSN: [id number] Arrival date & time: 11/13/19  1123     History Chief Complaint  Patient presents with  . Chest Pain  . Shortness of Breath    [name] is a 59 y.o. female.  HPI  59 year old female, with a PMH of colon cancer, endocarditis in 2011, Sjogren's, HTN, HLD, obese, presents with chest pain.  Patient describes the pain as a sharp pain in the midsternal region, nonradiating.  She notes she had the pain a week ago and then it resolved spontaneously.  Pain started again last night while she was asleep.  She states it woke her from her sleep.  She notes associated shortness of breath.  She does state that the shortness of breath is worse with exertion but denies any aggravating or alleviating factors of the chest pain. She does note some mild nausea.  She denies any vomiting, fevers, chills, cough.  She denies any recent travel/trauma/immobilization, peripheral edema, previously diagnosed PE/DVT.  She denies a personal or family history of MI.  She notes that she has not had a cardiac cath in the last 10 years.     Past Medical History:  Diagnosis Date  . Colon cancer ([place])    finished chemo feb 2009  . Endocarditis   . Enlarged heart   . Helicobacter pylori gastritis 12/23/08  . Hyperplastic colon polyp 12/23/08  . Iron deficiency anemia   . Miscarriage   . Sjoegren syndrome     Patient Active Problem List   Diagnosis Date Noted  . Nonspecific abnormal results of liver function study 09/10/2012  . H/O colon cancer, stage III 12/29/2011  . Abdominal pain, epigastric 12/29/2011    Past Surgical History:  Procedure Laterality Date  . BREAST REDUCTION SURGERY    . DILATION AND CURETTAGE OF UTERUS     x 7  . HEMICOLECTOMY     2008  . REDUCTION MAMMAPLASTY Bilateral 1993     OB History   No obstetric history on file.     Family History  Problem Relation Age of Onset  . Anemia Mother   .  Diabetes Mother   . Kidney disease Mother   . Colon cancer Neg Hx     Social History   Tobacco Use  . Smoking status: Never Smoker  . Smokeless tobacco: Never Used  Substance Use Topics  . Alcohol use: No  . Drug use: No    Home Medications Prior to Admission medications   Medication Sig Start Date End Date Taking? Authorizing Provider  amLODipine (NORVASC) 10 MG tablet Take 10 mg by mouth daily.     Yes [provider]  Cholecalciferol (VITAMIN D3) 50 MCG (2000 UT) capsule Take 6,000 Units by mouth daily.   Yes [provider]  IRON PO Take 10 mLs by mouth 2 (two) times daily. Organic iron liquid   Yes [provider]  Menthol, Topical Analgesic, (ICY HOT BACK EX) Apply 1 application topically as needed (knees/shoulder).   Yes [provider]  Multiple Vitamins-Minerals (HAIR SKIN AND NAILS FORMULA PO) Take 1 tablet by mouth daily.   Yes [provider]  mycophenolate (CELLCEPT) 500 MG tablet Take 500 mg by mouth 2 (two) times daily.     Yes [provider]  traMADol (ULTRAM) 50 MG tablet Take 50 mg by mouth 3 (three) times daily. For pain. Maximum dose= 8 tablets per day    Yes [provider]  methocarbamol (ROBAXIN) 500 MG tablet Take 1 tablet (500 mg total) by mouth 2 (two) times daily. Patient not taking: Reported on 11/13/2019 04/26/19   Antonietta Breach, PA-C  predniSONE (DELTASONE) 20 MG tablet Two daily with food Patient not taking: Reported on 08/22/2018 04/04/18   Robyn Haber, MD    Allergies    Venofer [ferric oxide], Dilaudid [hydromorphone hcl], and Lorazepam  Review of Systems   Review of Systems  Constitutional: Negative for chills and fever.  HENT: Negative for rhinorrhea and sore throat.   Eyes: Negative for visual disturbance.  Respiratory: Positive for shortness of breath. Negative for cough.   Cardiovascular: Positive for chest pain. Negative for leg swelling.  Gastrointestinal: Positive for  nausea. Negative for abdominal pain, diarrhea and vomiting.  Genitourinary: Negative for dysuria, frequency and urgency.  All other systems reviewed and are negative.   Physical Exam Updated Vital Signs BP (!) 125/94   Pulse 71   Temp 98.4 F (36.9 C) (Oral)   Resp 20   Ht 5\' 10"  (1.778 m)   Wt 92.5 kg   SpO2 94%   BMI 29.27 kg/m   Physical Exam Vitals and nursing note reviewed.  Constitutional:      Appearance: She is well-developed.  HENT:     Head: Normocephalic and atraumatic.  Eyes:     Conjunctiva/sclera: Conjunctivae normal.  Cardiovascular:     Rate and Rhythm: Normal rate and regular rhythm.     Heart sounds: Normal heart sounds. No murmur.  Pulmonary:     Effort: Tachypnea present. No accessory muscle usage or respiratory distress.     Breath sounds: Normal breath sounds. No wheezing or rales.  Chest:    Abdominal:     General: Bowel sounds are normal. There is no distension.     Palpations: Abdomen is soft.     Tenderness: There is no abdominal tenderness.  Musculoskeletal:        General: No tenderness or deformity. Normal range of motion.     Cervical back: Neck supple.  Skin:    General: Skin is warm and dry.     Findings: No erythema or rash.  Neurological:     Mental Status: She is alert and oriented to person, place, and time.  Psychiatric:        Behavior: Behavior normal.     ED Results / Procedures / Treatments   Labs (all labs ordered are listed, but only abnormal results are displayed) Labs Reviewed  CBC - Abnormal; Notable for the following components:      Result Value   RBC 5.27 (*)    RDW 15.9 (*)    All other components within normal limits  BASIC METABOLIC PANEL  I-STAT BETA HCG BLOOD, ED ([facility], [facility], [facility] ONLY)  TROPONIN I (HIGH SENSITIVITY)  TROPONIN I (HIGH SENSITIVITY)    EKG None  Radiology DG Chest 2 View  Result Date: 11/13/2019 CLINICAL DATA:  Mid chest pain since last evening. EXAM: CHEST - 2 VIEW COMPARISON:   08/22/2018 FINDINGS: The cardiac silhouette, mediastinal and hilar contours are within normal limits. Moderate tortuosity of the thoracic aorta appears relatively stable when compared to prior MRI of the abdomen from 07/18/2019. No definite hiatal hernia. Mild eventration right hemidiaphragm. No infiltrates, effusions or edema. No worrisome pulmonary lesions. The bony thorax is intact. IMPRESSION: 1. No acute cardiopulmonary findings. 2. Stable moderate tortuosity of the thoracic aorta. Electronically Signed   By: Marijo Sanes M.D.   On: 11/13/2019 13:00  CT Angio Chest PE W/Cm &/Or Wo Cm  Result Date: 11/13/2019 CLINICAL DATA:  Short of breath, midsternal chest pain for 1 week EXAM: CT ANGIOGRAPHY CHEST WITH CONTRAST TECHNIQUE: Multidetector CT imaging of the chest was performed using the standard protocol during bolus administration of intravenous contrast. Multiplanar CT image reconstructions and MIPs were obtained to evaluate the vascular anatomy. CONTRAST:  165mL OMNIPAQUE IOHEXOL 350 MG/ML SOLN COMPARISON:  11/13/2019, 08/23/2011 FINDINGS: Cardiovascular: This is a technically adequate evaluation of the pulmonary vasculature. There are no filling defects or pulmonary emboli. Heart and great vessels are unremarkable without significant pericardial effusion. Mediastinum/Nodes: No enlarged mediastinal, hilar, or axillary lymph nodes. Thyroid gland, trachea, and esophagus demonstrate no significant findings. Lungs/Pleura: No acute airspace disease, effusion, or pneumothorax. Central airways are widely patent. Upper Abdomen: No acute abnormality. Musculoskeletal: There are no acute or destructive bony lesions. Reconstructed images demonstrate no additional findings. Review of the MIP images confirms the above findings. IMPRESSION: 1. No evidence of pulmonary embolus. No acute intrathoracic process. Electronically Signed   By: Randa Ngo M.D.   On: 11/13/2019 14:04    Procedures Procedures (including  critical care time)  Medications Ordered in ED Medications  sodium chloride (PF) 0.9 % injection (has no administration in time range)  ketorolac (TORADOL) 15 MG/ML injection 15 mg (has no administration in time range)  morphine 4 MG/ML injection 4 mg (4 mg Intravenous Given 11/13/19 1211)  ondansetron (ZOFRAN) injection 4 mg (4 mg Intravenous Given 11/13/19 1402)  fentaNYL (SUBLIMAZE) injection 50 mcg (50 mcg Intravenous Given 11/13/19 1403)  iohexol (OMNIPAQUE) 350 MG/ML injection 100 mL (100 mLs Intravenous Contrast Given 11/13/19 1341)    ED Course  I have reviewed the triage vital signs and the nursing notes.  Pertinent labs & imaging results that were available during my care of the patient were reviewed by me and considered in my medical decision making (see chart for details).    MDM Rules/Calculators/A&P                      Patient presents with chest pain.  She has a pinpoint area of pain to the midsternal region.  Pain easily reproduced on palpation.  Heart score of three.  She has had two troponins, the first which was three and then five.  Per heart pathway, not consistent with ACS.  Chest x-ray shows no acute findings.  CT angio shows no evidence of PE.  Patient feeling significantly better on reevaluation.  Vital signs improved.  Blood work unremarkable.  History and physical not consistent with ACS, PE, pericarditis, dissection.  History is most consistent with a musculoskeletal pain given pinpoint and easily reproducible.  Encouraged NSAIDs and close follow-up with cardiology and PCP.  Patient expressed understanding and is agreeable with plan.  Given return precautions.  Patient ready and stable for discharge.   At this time there does not appear to be any evidence of an acute emergency medical condition and the patient appears stable for discharge with appropriate outpatient follow up.Diagnosis was discussed with patient who verbalizes understanding and is agreeable to discharge.     Final Clinical Impression(s) / ED Diagnoses Final diagnoses:  None    Rx / DC Orders ED Discharge Orders    None       Rachel Moulds 11/13/19 2045    Davonna Belling, MD 11/15/19 1553

## 2019-11-27 MED FILL — AMLODIPINE BESYLATE 10 MG T: 10 | 90 days supply | Qty: 90 | Fill #1

## 2019-11-29 MED FILL — traMADol HCL 50 MG TABS: 50 | 30 days supply | Qty: 90 | Fill #0

## 2019-12-04 NOTE — Progress Notes (Signed)
Cardiology Office Note:    Date:  12/05/2019   ID:  [name], DOB 11-10-1960, MRN [id number]  PCP:  Seward Carol, MD  Cardiologist:  No primary care provider on file.   Referring MD: Seward Carol, MD   Chief Complaint  Patient presents with  . Chest Pain    Recurrent    History of Present Illness:    [name] is a 59 y.o. female with a hx of recent ER visit for chest pain, recurrent chest pain, cardiomegaly, and Sjogren's syndrome since 2009 (Dr. Jeannine Boga in [facility] rheumatology [facility]).  The patient had a recent visit to the emergency room for chest pain, had ischemic work-up and was ultimately discharged with instructions to follow-up with outpatient cardiology evaluation.  The patient reminds me that I have seen her in the remote past.  She gives a history and 2014 of having severe chest discomfort, worse when lying, which was evaluated and she was told she had "endocarditis".  This diagnosis was made in the emergency room, symptoms lasted several days, and eventually resolved.  There was associated shortness of breath.  It is unlikely that this was endocarditis because antibiotics were not given, an echocardiogram was not performed, and no diagnosis of bacteremia was present.  She has a history of Sjogren's disease diagnosed in 2009 as noted above.  The most recent episode of chest discomfort occurred in February 2021 with center sternal chest discomfort described as sharp, worse when lying flat, associated with dyspnea, and lasted 10 days.  ECG done at that time was unremarkable.  Inflammatory markers were not obtained.  She eventually realized that Naprosyn would help to relieve the discomfort.  It gradually got better.  Arrangements for cardiology consultation were made at the time for this visit.  Past Medical History:  Diagnosis Date  . Colon cancer ([place])    finished chemo feb 2009  . Endocarditis   . Enlarged heart   . Helicobacter pylori  gastritis 12/23/08  . Hyperplastic colon polyp 12/23/08  . Iron deficiency anemia   . Miscarriage   . Sjoegren syndrome     Past Surgical History:  Procedure Laterality Date  . BREAST REDUCTION SURGERY    . DILATION AND CURETTAGE OF UTERUS     x 7  . HEMICOLECTOMY     2008  . REDUCTION MAMMAPLASTY Bilateral 1993    Current Medications: Current Meds  Medication Sig  . amLODipine (NORVASC) 10 MG tablet Take 10 mg by mouth daily.    . Cholecalciferol (VITAMIN D3) 50 MCG (2000 UT) capsule Take 6,000 Units by mouth daily.  . IRON PO Take 10 mLs by mouth 2 (two) times daily. Organic iron liquid  . Menthol, Topical Analgesic, (ICY HOT BACK EX) Apply 1 application topically as needed (knees/shoulder).  . Multiple Vitamins-Minerals (HAIR SKIN AND NAILS FORMULA PO) Take 1 tablet by mouth daily.  . mycophenolate (CELLCEPT) 500 MG tablet Take 500 mg by mouth 2 (two) times daily.    . traMADol (ULTRAM) 50 MG tablet Take 50 mg by mouth 3 (three) times daily. For pain. Maximum dose= 8 tablets per day      Allergies:   Venofer [ferric oxide], Dilaudid [hydromorphone hcl], and Lorazepam   Social History   Socioeconomic History  . Marital status: Married    Spouse name: Not on file  . Number of children: Not on file  . Years of education: Not on file  . Highest education level: Not on  file  Occupational History  . Not on file  Tobacco Use  . Smoking status: Never Smoker  . Smokeless tobacco: Never Used  Substance and Sexual Activity  . Alcohol use: No  . Drug use: No  . Sexual activity: Not on file  Other Topics Concern  . Not on file  Social History Narrative  . Not on file   Social Determinants of Health   Financial Resource Strain:   . Difficulty of Paying Living Expenses: Not on file  Food Insecurity:   . Worried About Charity fundraiser in the Last Year: Not on file  . Ran Out of Food in the Last Year: Not on file  Transportation Needs:   . Lack of Transportation  (Medical): Not on file  . Lack of Transportation (Non-Medical): Not on file  Physical Activity:   . Days of Exercise per Week: Not on file  . Minutes of Exercise per Session: Not on file  Stress:   . Feeling of Stress : Not on file  Social Connections:   . Frequency of Communication with Friends and Family: Not on file  . Frequency of Social Gatherings with Friends and Family: Not on file  . Attends Religious Services: Not on file  . Active Member of Clubs or Organizations: Not on file  . Attends Archivist Meetings: Not on file  . Marital Status: Not on file     Family History: The patient's family history includes Anemia in her mother; Diabetes in her mother; Kidney disease in her mother. There is no history of Colon cancer.  ROS:   Please see the history of present illness.    Dry red eyes.  Occasional muscle and joint discomfort.  All other systems reviewed and are negative.  EKGs/Labs/Other Studies Reviewed:    The following studies were reviewed today: CT scan done in the emergency room 11/13/2019 was unremarkable for PE and no mention of increased cardiac silhouette demonstrate evidence of pericardial effusion.  EKG:  EKG done 11/13/2019 incomplete right bundle branch block, left axis deviation, nonspecific T wave abnormality, and horizontal QRS axis.  Recent Labs: 11/13/2019: BUN 13; Creatinine, Ser 0.99; Hemoglobin 13.9; Platelets 286; Potassium 3.8; Sodium 141  Recent Lipid Panel No results found for: CHOL, TRIG, HDL, CHOLHDL, VLDL, LDLCALC, LDLDIRECT  Physical Exam:    VS:  BP 122/80   Pulse 93   Ht 5\' 10"  (1.778 m)   Wt 210 lb (95.3 kg)   SpO2 98%   BMI 30.13 kg/m     Wt Readings from Last 3 Encounters:  12/05/19 210 lb (95.3 kg)  11/13/19 204 lb (92.5 kg)  08/22/18 193 lb (87.5 kg)     GEN: African-American female slightly overweight.. No acute distress HEENT: Normal NECK: No JVD. LYMPHATICS: No lymphadenopathy CARDIAC: S4 gallop is audible.   RRR without murmur, S3 gallop, or edema. VASCULAR:  Normal Pulses. No bruits. RESPIRATORY:  Clear to auscultation without rales, wheezing or rhonchi  ABDOMEN: Soft, non-tender, non-distended, No pulsatile mass, MUSCULOSKELETAL: No deformity  SKIN: Warm and dry NEUROLOGIC:  Alert and oriented x 3 PSYCHIATRIC:  Normal affect   ASSESSMENT:    1. Chest pain of uncertain etiology   2. Other endocarditis, unspecified chronicity   3. H/O colon cancer, stage III   4. 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 Precordial pain   6. Pericarditis, unspecified chronicity, unspecified type   7. Educated about COVID-19 virus infection    PLAN:  In order of problems listed above:  1. This patient likely has recurrent inflammatory chest pain, likely pericarditis related to her underlying chronic inflammatory disease, Sjogren's.  This most recent episode improved with Naprosyn. 2. I do not believe the patient has ever had endocarditis and this most likely that she had pericarditis when the "endocarditis" diagnosis was made and possibly misunderstood by the patient. 3. Doing well status post chemotherapy. 4. Managed by Dr. Gavin Pound.  Currently on CellCept. 5. Recurring episodes of precordial chest pain are likely related to Sjogren's and possibly/probably pericarditis. 6. Same.  Will do 2D Doppler echocardiogram to establish a baseline for structural abnormality investigation. 7. 3W's and COVID-19 vaccine are endorsed.   Medication Adjustments/Labs and Tests Ordered: Current medicines are reviewed at length with the patient today.  Concerns regarding medicines are outlined above.  Orders Placed This Encounter  Procedures  . ECHOCARDIOGRAM COMPLETE   No orders of the defined types were placed in this encounter.   Patient Instructions  Medication Instructions:  Your physician recommends that you continue on your current medications as directed. Please refer to the  Current Medication list given to you today.  *If you need a refill on your cardiac medications before your next appointment, please call your pharmacy*  Lab Work: None If you have labs (blood work) drawn today and your tests are completely normal, you will receive your results only by: Marland Kitchen MyChart Message (if you have MyChart) OR . A paper copy in the mail If you have any lab test that is abnormal or we need to change your treatment, we will call you to review the results.  Testing/Procedures: Your physician has requested that you have an echocardiogram. Echocardiography is a painless test that uses sound waves to create images of your heart. It provides your doctor with information about the size and shape of your heart and how well your heart's chambers and valves are working. This procedure takes approximately one hour. There are no restrictions for this procedure.   Follow-Up: At [facility], you and your health needs are our priority.  As part of our continuing mission to provide you with exceptional heart care, we have created designated Provider Care Teams.  These Care Teams include your primary Cardiologist (physician) and Advanced Practice Providers (APPs -  Physician Assistants and Nurse Practitioners) who all work together to provide you with the care you need, when you need it.  Your next appointment:   As needed  The format for your next appointment:   In Person  Provider:   You may see Dr. Daneen Schick or one of the following Advanced Practice Providers on your designated Care Team:    Truitt Merle, NP  Cecilie Kicks, NP  Kathyrn Drown, NP   Other Instructions      Signed, Sinclair Grooms, MD  12/05/2019 4:22 PM    [place]

## 2019-12-05 ENCOUNTER — Ambulatory Visit (INDEPENDENT_AMBULATORY_CARE_PROVIDER_SITE_OTHER): Payer: 59 | Admitting: Interventional Cardiology

## 2019-12-05 ENCOUNTER — Other Ambulatory Visit: Payer: Self-pay

## 2019-12-05 ENCOUNTER — Encounter: Payer: Self-pay | Admitting: Interventional Cardiology

## 2019-12-05 VITALS — BP 122/80 | HR 93 | Ht 70.0 in | Wt 210.0 lb

## 2019-12-05 DIAGNOSIS — R072 Precordial pain: Secondary | ICD-10-CM

## 2019-12-05 DIAGNOSIS — M3501 Sicca syndrome with keratoconjunctivitis: Secondary | ICD-10-CM | POA: Diagnosis not present

## 2019-12-05 DIAGNOSIS — I38 Endocarditis, valve unspecified: Secondary | ICD-10-CM | POA: Diagnosis not present

## 2019-12-05 DIAGNOSIS — Z7189 Other specified counseling: Secondary | ICD-10-CM | POA: Diagnosis not present

## 2019-12-05 DIAGNOSIS — Z85038 Personal history of other malignant neoplasm of large intestine: Secondary | ICD-10-CM | POA: Diagnosis not present

## 2019-12-05 DIAGNOSIS — I319 Disease of pericardium, unspecified: Secondary | ICD-10-CM

## 2019-12-05 DIAGNOSIS — R079 Chest pain, unspecified: Secondary | ICD-10-CM

## 2019-12-05 NOTE — Patient Instructions (Signed)
Medication Instructions:  Your physician recommends that you continue on your current medications as directed. Please refer to the Current Medication list given to you today.  *If you need a refill on your cardiac medications before your next appointment, please call your pharmacy*  Lab Work: None If you have labs (blood work) drawn today and your tests are completely normal, you will receive your results only by: Marland Kitchen MyChart Message (if you have MyChart) OR . A paper copy in the mail If you have any lab test that is abnormal or we need to change your treatment, we will call you to review the results.  Testing/Procedures: Your physician has requested that you have an echocardiogram. Echocardiography is a painless test that uses sound waves to create images of your heart. It provides your doctor with information about the size and shape of your heart and how well your heart's chambers and valves are working. This procedure takes approximately one hour. There are no restrictions for this procedure.   Follow-Up: At [facility] [place], you and your health needs are our priority.  As part of our continuing mission to provide you with exceptional heart care, we have created designated Provider Care Teams.  These Care Teams include your primary Cardiologist (physician) and Advanced Practice Providers (APPs -  Physician Assistants and Nurse Practitioners) who all work together to provide you with the care you need, when you need it.  Your next appointment:   As needed  The format for your next appointment:   In Person  Provider:   You may see Dr. Daneen Schick or one of the following Advanced Practice Providers on your designated Care Team:    Truitt Merle, NP  Cecilie Kicks, NP  Kathyrn Drown, NP   Other Instructions

## 2019-12-09 ENCOUNTER — Ambulatory Visit: Payer: 59 | Admitting: Cardiology

## 2019-12-19 ENCOUNTER — Ambulatory Visit (HOSPITAL_COMMUNITY): Payer: 59 | Attending: Interventional Cardiology

## 2019-12-19 ENCOUNTER — Other Ambulatory Visit: Payer: Self-pay

## 2019-12-19 DIAGNOSIS — I319 Disease of pericardium, unspecified: Secondary | ICD-10-CM | POA: Insufficient documentation

## 2020-02-07 MED FILL — traMADol HCL 50 MG TABS: 50 | 30 days supply | Qty: 90 | Fill #0

## 2020-03-04 ENCOUNTER — Telehealth: Payer: Self-pay | Admitting: *Deleted

## 2020-03-04 NOTE — Telephone Encounter (Signed)
   [facility] Pre-operative Risk Assessment    HEARTCARE STAFF: - Please ensure there is not already an duplicate clearance open for this procedure. - Under Visit Info/Reason for Call, type in Other and utilize the format Clearance MM/DD/YY or Clearance TBD. Do not use dashes or single digits. - If request is for dental extraction, please clarify the # of teeth to be extracted.  Request for surgical clearance:  1. What type of surgery is being performed? 1 SINGLE TOOTH EXTRACTION   2. When is this surgery scheduled? TBD   3. What type of clearance is required (medical clearance vs. Pharmacy clearance to hold med vs. Both)? MEDICAL  4. Are there any medications that need to be held prior to surgery and how long?NONE LISTED   5. Practice name and name of physician performing surgery? [facility]; DR. CRISP   6. What is the office phone number? [phone]   7.   What is the office fax number? [phone]  8.   Anesthesia type (None, local, MAC, general) ? IV SEDATION (TYPICALLY USED PROPOFOL, FENTANYL, VERSED)    Julaine Hua 03/04/2020, 3:51 PM  _________________________________________________________________   (provider comments below)

## 2020-03-04 NOTE — Telephone Encounter (Signed)
   Primary Cardiologist: Sinclair Grooms, MD  Chart reviewed as part of pre-operative protocol coverage. Simple dental extractions are considered low risk procedures per guidelines and generally do not require any specific cardiac clearance. It is also generally accepted that for simple extractions and dental cleanings, there is no need to interrupt blood thinner therapy.   SBE prophylaxis is not required for the patient.  I will route this recommendation to the requesting party via Epic fax function and remove from pre-op pool.  Please call with questions.  Deberah Pelton, NP 03/04/2020, 4:05 PM

## 2020-03-31 MED FILL — HYDROCODON-APAP 5-325: 5-325 | 1 days supply | Qty: 5 | Fill #0

## 2020-03-31 MED FILL — IBUPROFEN 400 MG TABS: 400 | 8 days supply | Qty: 30 | Fill #0

## 2020-03-31 MED FILL — CHLORHEXIDINE 0.12% RINSE: 0.12 | 16 days supply | Qty: 473 | Fill #0

## 2020-04-06 MED FILL — traMADol HCL 50 MG TABS: 50 | 30 days supply | Qty: 90 | Fill #0

## 2020-05-27 ENCOUNTER — Other Ambulatory Visit: Payer: Self-pay

## 2020-05-27 ENCOUNTER — Encounter: Payer: Self-pay | Admitting: Physician Assistant

## 2020-05-27 ENCOUNTER — Ambulatory Visit: Payer: 59 | Admitting: Physician Assistant

## 2020-05-27 ENCOUNTER — Telehealth: Payer: Self-pay | Admitting: *Deleted

## 2020-05-27 VITALS — BP 124/90 | HR 83 | Ht 70.0 in | Wt 209.8 lb

## 2020-05-27 DIAGNOSIS — R079 Chest pain, unspecified: Secondary | ICD-10-CM

## 2020-05-27 DIAGNOSIS — R0781 Pleurodynia: Secondary | ICD-10-CM

## 2020-05-27 DIAGNOSIS — M3501 Sicca syndrome with keratoconjunctivitis: Secondary | ICD-10-CM

## 2020-05-27 DIAGNOSIS — I318 Other specified diseases of pericardium: Secondary | ICD-10-CM

## 2020-05-27 MED ORDER — COLCHICINE 0.6 MG PO TABS: ORAL_TABLET | ORAL | 3 refills | Status: DC

## 2020-05-27 NOTE — Addendum Note (Signed)
Addended by: Gaetano Net on: 05/27/2020 10:49 AM   Modules accepted: Orders

## 2020-05-27 NOTE — Progress Notes (Addendum)
Cardiology Office Note    Date:  05/27/2020   ID:  [name], DOB 1961/02/08, MRN [id number]  PCP:  Seward Carol, MD  Cardiologist:  Dr. Tamala Julian Chief Complaint: 6 Months follow up  History of Present Illness:   [name] is a 59 y.o. female Sjogren's syndrome since 2009 (Dr. Jeannine Boga in [facility] rheumatology [facility]), cardiomegaly and recurrent chest pain here for follow up.   Most recently seen by Dr. Tamala Julian February 2021 for chest pain.  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  This improved with naprosyn.  Dr. Tamala Julian felt patient never had endocarditis in the past.  Follow-up echocardiogram March 2021 showed normal LV function and grade 2 diastolic dysfunction.  Small pericardial effusion.  This finding was consistent with diagnosis of pericarditis.  Advised to discuss with her rheumatologist given Sjogren's disease.  Patient is here for follow-up.  Patient reports, her chest discomfort persistent since last seen and for the past few months it has progressively gotten worse.  She described her pain as a sharp dull aching worse with bending over with shortness of breath.  She was admitted overnight at [facility] in July due to ongoing symptoms.  She was ruled out.  She had a normal stress test (reviewed personally on patient's chart via iphone).  Her troponin was normal.  Due to worsening symptoms she made this appointment.  Patient walks 1 mile 3 times per day.  During walking her chest discomfort never exacerbated.  She takes ibuprofen 800 every day and tramadol at night.  Sometimes her pain wakes her from sleep.  No fever or chills.  Past Medical History:  Diagnosis Date  . Colon cancer ([place])    finished chemo feb 2009  . Endocarditis   . Enlarged heart   . Helicobacter pylori gastritis 12/23/08  . Hyperplastic colon polyp 12/23/08  . Iron deficiency anemia   . Miscarriage   . Sjoegren  syndrome     Past Surgical History:  Procedure Laterality Date  . BREAST REDUCTION SURGERY    . DILATION AND CURETTAGE OF UTERUS     x 7  . HEMICOLECTOMY     2008  . REDUCTION MAMMAPLASTY Bilateral 1993    Current Medications: Prior to Admission medications   Medication Sig Start Date End Date Taking? Authorizing Provider  amLODipine (NORVASC) 10 MG tablet Take 10 mg by mouth daily.     Yes [provider]  Cholecalciferol (VITAMIN D3) 50 MCG (2000 UT) capsule Take 6,000 Units by mouth daily.   Yes [provider]  IRON PO Take 10 mLs by mouth 2 (two) times daily. Organic iron liquid   Yes [provider]  Menthol, Topical Analgesic, (ICY HOT BACK EX) Apply 1 application topically as needed (knees/shoulder).   Yes [provider]  Multiple Vitamins-Minerals (HAIR SKIN AND NAILS FORMULA PO) Take 1 tablet by mouth daily.   Yes [provider]  mycophenolate (CELLCEPT) 500 MG tablet Take 500 mg by mouth 2 (two) times daily.     Yes [provider]  traMADol (ULTRAM) 50 MG tablet Take 50 mg by mouth 3 (three) times daily. For pain. Maximum dose= 8 tablets per day    Yes [provider]    Allergies:   Venofer [ferric oxide], Dilaudid [hydromorphone hcl], and Lorazepam   Social History   Socioeconomic History  . Marital status: Married    Spouse name: Not on  file  . Number of children: Not on file  . Years of education: Not on file  . Highest education level: Not on file  Occupational History  . Not on file  Tobacco Use  . Smoking status: Never Smoker  . Smokeless tobacco: Never Used  Substance and Sexual Activity  . Alcohol use: No  . Drug use: No  . Sexual activity: Not on file  Other Topics Concern  . Not on file  Social History Narrative  . Not on file   Social Determinants of Health   Financial Resource Strain:   . Difficulty of Paying Living Expenses:   Food Insecurity:   . Worried About Ship broker in the Last Year:   . Arboriculturist in the Last Year:   Transportation Needs:   . Film/video editor (Medical):   Marland Kitchen Lack of Transportation (Non-Medical):   Physical Activity:   . Days of Exercise per Week:   . Minutes of Exercise per Session:   Stress:   . Feeling of Stress :   Social Connections:   . Frequency of Communication with Friends and Family:   . Frequency of Social Gatherings with Friends and Family:   . Attends Religious Services:   . Active Member of Clubs or Organizations:   . Attends Archivist Meetings:   Marland Kitchen Marital Status:      Family History:  The patient's family history includes Anemia in her mother; Diabetes in her mother; Kidney disease in her mother.   ROS:   Please see the history of present illness.    ROS All other systems reviewed and are negative.   PHYSICAL EXAM:   VS:  BP 124/90   Pulse 83   Ht 5\' 10"  (1.778 m)   Wt 209 lb 12.8 oz (95.2 kg)   SpO2 98%   BMI 30.10 kg/m    GEN: Well nourished, well developed, in no acute distress  HEENT: normal  Neck: no JVD, carotid bruits, or masses Cardiac: RRR; no murmurs, rubs, or gallops,no edema. Pain is reproducible with palpations  Respiratory:  clear to auscultation bilaterally, normal work of breathing GI: soft, nontender, nondistended, + BS MS: no deformity or atrophy  Skin: warm and dry, no rash Neuro:  Alert and Oriented x 3, Strength and sensation are intact Psych: euthymic mood, full affect  Wt Readings from Last 3 Encounters:  05/27/20 209 lb 12.8 oz (95.2 kg)  12/05/19 210 lb (95.3 kg)  11/13/19 204 lb (92.5 kg)      Studies/Labs Reviewed:   EKG:  EKG is not ordered today.    Recent Labs: 11/13/2019: BUN 13; Creatinine, Ser 0.99; Hemoglobin 13.9; Platelets 286; Potassium 3.8; Sodium 141   Lipid Panel No results found for: CHOL, TRIG, HDL, CHOLHDL, VLDL, LDLCALC, LDLDIRECT  Additional studies/ records that were reviewed today include:   Echocardiogram:  12/2019 1. Small pericardial effusion, best seen anteriorly but appears  circumerential. No septal shudder or respirophasic septal shift. No  definite findings to suggest constrictive physiology, though study was not  performed with respirometer for full  assessment.  2. Left ventricular ejection fraction, by estimation, is 60 to 65%. The  left ventricle has normal function. The left ventricle has no regional  wall motion abnormalities. There is mild asymmetric left ventricular  hypertrophy of the basal-septal segment.  Left ventricular diastolic parameters are consistent with Grade II  diastolic dysfunction (pseudonormalization). Elevated left ventricular  end-diastolic pressure.  3. Right  ventricular systolic function is normal. The right ventricular  size is normal. There is normal pulmonary artery systolic pressure. The  estimated right ventricular systolic pressure is 27.7 mmHg.  4. The mitral valve is normal in structure. Mild mitral valve  regurgitation. No evidence of mitral stenosis.  5. The aortic valve is tricuspid. Aortic valve regurgitation is not  visualized. No aortic stenosis is present.  6. The inferior vena cava is normal in size with greater than 50%  respiratory variability, suggesting right atrial pressure of 3 mmHg.    ASSESSMENT & PLAN:    1. Chest discomfort/pericarditis test small pericardial effusion Patient was diagnosed with pericarditis related to her underlying chronic inflammatory disease, Sjogren's syndrome feb 2021. Seems she never completely recovered.  Patient had normal outside hospital stress test.  Her symptoms does not exacerbates  with walking. Her symptoms get worse with bending over and laying down. She has pleuritic and MSK component. Reviewed and discussed with her cardiologist Dr. Tamala Julian.  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  Trial of colchicine.  She has follow-up with her  rheumatologist next month.  Addendum: Canceled limited echocardiogram.  Patient had echocardiogram done during her admission in July.  No evidence of pericardial effusion. Reviewed personally. Trial of colchicine.    Medication Adjustments/Labs and Tests Ordered: Current medicines are reviewed at length with the patient today.  Concerns regarding medicines are outlined above.  Medication changes, Labs and Tests ordered today are listed in the Patient Instructions below. Patient Instructions  Medication Instructions:  Your physician has recommended you make the following change in your medication:  1.  START Colchicine 0.6 mg taking 1 twice a day for 1 week then go down to 1 daily   *If you need a refill on your cardiac medications before your next appointment, please call your pharmacy*   Lab Work: None ordered  If you have labs (blood work) drawn today and your tests are completely normal, you will receive your results only by: Marland Kitchen MyChart Message (if you have MyChart) OR . A paper copy in the mail If you have any lab test that is abnormal or we need to change your treatment, we will call you to review the results.   Testing/Procedures: Your physician has requested that you have an limited echocardiogram. Echocardiography is a painless test that uses sound waves to create images of your heart. It provides your doctor with information about the size and shape of your heart and how well your heart's chambers and valves are working. This procedure takes approximately one hour. There are no restrictions for this procedure.    Follow-Up: At [facility], you and your health needs are our priority.  As part of our continuing mission to provide you with exceptional heart care, we have created designated Provider Care Teams.  These Care Teams include your primary Cardiologist (physician) and Advanced Practice Providers (APPs -  Physician Assistants and Nurse Practitioners) who all work  together to provide you with the care you need, when you need it.  We recommend signing up for the patient portal called "MyChart".  Sign up information is provided on this After Visit Summary.  MyChart is used to connect with patients for Virtual Visits (Telemedicine).  Patients are able to view lab/test results, encounter notes, upcoming appointments, etc.  Non-urgent messages can be sent to your provider as well.   To learn more about what you can do with MyChart, go to  [email or web address].    Your next appointment:   06/30/2020 8:45  The format for your next appointment:   Virtual Visit   Provider:   Robbie Lis, PA-C   Other Instructions  Echocardiogram An echocardiogram is a procedure that uses painless sound waves (ultrasound) to produce an image of the heart. Images from an echocardiogram can provide important information about:  Signs of coronary artery disease (CAD).  Aneurysm detection. An aneurysm is a weak or damaged part of an artery wall that bulges out from the normal force of blood pumping through the body.  Heart size and shape. Changes in the size or shape of the heart can be associated with certain conditions, including heart failure, aneurysm, and CAD.  Heart muscle function.  Heart valve function.  Signs of a past heart attack.  Fluid buildup around the heart.  Thickening of the heart muscle.  A tumor or infectious growth around the heart valves. Tell a health care provider about:  Any allergies you have.  All medicines you are taking, including vitamins, herbs, eye drops, creams, and over-the-counter medicines.  Any blood disorders you have.  Any surgeries you have had.  Any medical conditions you have.  Whether you are pregnant or may be pregnant. What are the risks? Generally, this is a safe procedure. However, problems may occur, including:  Allergic reaction to dye (contrast) that may be used during the procedure. What happens before  the procedure? No specific preparation is needed. You may eat and drink normally. What happens during the procedure?   An IV tube may be inserted into one of your veins.  You may receive contrast through this tube. A contrast is an injection that improves the quality of the pictures from your heart.  A gel will be applied to your chest.  A wand-like tool (transducer) will be moved over your chest. The gel will help to transmit the sound waves from the transducer.  The sound waves will harmlessly bounce off of your heart to allow the heart images to be captured in real-time motion. The images will be recorded on a computer. The procedure may vary among health care providers and hospitals. What happens after the procedure?  You may return to your normal, everyday life, including diet, activities, and medicines, unless your health care provider tells you not to do that. Summary  An echocardiogram is a procedure that uses painless sound waves (ultrasound) to produce an image of the heart.  Images from an echocardiogram can provide important information about the size and shape of your heart, heart muscle function, heart valve function, and fluid buildup around your heart.  You do not need to do anything to prepare before this procedure. You may eat and drink normally.  After the echocardiogram is completed, you may return to your normal, everyday life, unless your health care provider tells you not to do that. This information is not intended to replace advice given to you by your health care provider. Make sure you discuss any questions you have with your health care provider. Document Revised: 01/17/2019 Document Reviewed: 10/29/2016 Elsevier Patient Education  [facility] team (Cardiologist [Heart Doctor] and Journalist, newspaper Assistant; Nurse Practitioner]) has arranged for your next office appointment to be a virtual visit (also  known as "Telehealth", "Telemedicine", "E-Visit").   We now offer virtual visits for all our patients.  This helps [place] to expand our ability to see patients in a timely and  safe manner.  These visits are billed to your insurance just like traditional, in person, appointments.  Please review this IMPORTANT information about your upcoming appointment.   **PLEASE READ THE SECTION BELOW LABELED "CONSENT".**   **CALL OUR OFFICE WITH QUESTIONS.**   WHAT YOU NEED FOR YOUR VIRTUAL VISIT:  You will need a SmartPhone with microphone and video capability.  [If you are using MyChart to connect to your visit, it is also possible to use a desktop/laptop computer (with an Internet connection), as long as you have microphone and video capability.]  You will need to use Chrome, Edge or [employer] as your Wellsite geologist.  We highly recommend that you have a MyChart account as this will make connecting to your visit seamless.  A MyChart account not only allows you to connect to your provider for a virtual visit, but also allows you to see the results of all your tests, provider notes, medications and upcoming appointments.    A MyChart account is not absolutely necessary.  We can still complete your visit if you do not have one.    If you do not have a computer or SmartPhone with video/microphone capability or your Internet/cell service is weak on the day of your visit, we will do your visit by telephone.    A blood pressure cuff and scale are essential to collect your vital signs at home.  If you do not have these and you are unable to obtain them, please contact our office so that we can make arrangements for you.  If you have a pulse oximeter, Apple watch, Kardia mobile device, etc, you can collect data from these devices as well to share with the provider for your visit.  These devices are not required for a virtual visit.    WHAT TO DO ON THE DAY OF YOUR APPOINTMENT: 30 minutes before your  appointment:  Take your blood pressure, pulse or heart rate (if your blood pressure machine is able to collect it) and weight.  Write all these numbers down so you can give it to the nurse or medical assistant that calls.  Get all of the medications you currently take and put them where you will be sitting for the appointment.  The nurse/medical assistant will go over these with you when he/she calls.  15 minutes before your appointment:  You will receive a phone call from a nurse or medical assistant from our office.  The caller ID on your phone may indicate that the caller is either "[facility]" or "[place]".  However, the number may come across as spam.  Please turn off any spam blocker so you do not miss the call.  The nurse will:  Ask for your blood pressure, pulse, weight, height.  Go over all of your medications to make sure your chart is correct.  Review your allergies, smoking history, reason for appointment, etc.   Give you instructions on how to connect with the video platform or telephone.  IF YOUR VISIT IS BY TELEPHONE ONLY: After the nurse finishes getting you ready for the visit, your provider will call you on the phone number you provide to [place].   TO CONNECT WITH YOUR PROVIDER FOR YOUR APPOINTMENT (BY VIDEO): You will either connect with the provider with your MyChart account or (if you are not using MyChart) with a link sent to your SmartPhone by text message.  If you are using MyChart, see below.  If you are not using MyChart, the nurse  will send you the text message during the phone call.     If you are connecting with your MyChart account:   (The nurse that calls you will tell you when to do this):  You will log into your account. At the top of your home screen, you should see the following prompt that tells you to "Begin your video visit with . . . ".  Click the green button (BEGIN VISIT).    The next screen will say "It's time to start your video  visit!" Click the green button (BEGIN VIDEO VISIT)    There may be a screen that appears that asks for permission to use your camera and/or microphone.  Click ALLOW.  This will open the browser where your appointment will take place.   You may see the message: "Welcome.  Waiting for the call to begin."  Or, you will see that the nurse or your provider are already "in" the room waiting on you.   If you are connecting with a link sent to your SmartPhone via text: The nurse that calls you will send the link by text message. Click on the link (it should look something like this):     If asked to give permission to use the camera and or microphone, click ALLOW This will open the browser where your appointment will take place.      You may see the message: "Welcome.  Waiting for the call to begin."  Or, you will see that the nurse or your provider are already "in" the room waiting on you.    The controls for your visit look like the picture below.  Please note that this is what the microphone and camera look like when they are ON.  If muted, they will have a line through them.    After the appointment: Once your provider leaves the appointment, he/she will go over instructions with the nurse/medical assistant.  If needed, this person will call you with any instructions, appointments, etc.   A copy of your After Visit Summary (AVS) will be available later that day in your MyChart account.  This document will have all of your instructions, medications, appointments, etc.  If you are not using MyChart, we will mail it to your home.     *CONSENT FOR TELE-HEALTH VISIT - PLEASE REVIEW* By participating in the scheduled virtual visit (and any virtual visit scheduled within 365 days of the printing of this document), I agree to the following:   I hereby voluntarily request, consent and authorize [facility] and its employed or contracted physicians, physician assistants, nurse practitioners  or other licensed health care professionals (the Practitioner), to provide me with telemedicine health care services (the "Services") as deemed necessary by the treating Practitioner. I acknowledge and consent to receive the Services by the Practitioner via telemedicine. I understand that the telemedicine visit will involve communicating with the Practitioner through live audiovisual communication technology and the disclosure of certain medical information by electronic transmission. I acknowledge that I have been given the opportunity to request an in-person assessment or other available alternative prior to the telemedicine visit and am voluntarily participating in the telemedicine visit.  I understand that I have the right to withhold or withdraw my consent to the use of telemedicine in the course of my care at any time, without affecting my right to future care or treatment, and that the Practitioner or I may terminate the telemedicine visit at any time. I understand that  I have the right to inspect all information obtained and/or recorded in the course of the telemedicine visit and may receive copies of available information for a reasonable fee.  I understand that some of the potential risks of receiving the Services via telemedicine include:  Marland Kitchen Delay or interruption in medical evaluation due to technological equipment failure or disruption; . Information transmitted may not be sufficient (e.g. poor resolution of images) to allow for appropriate medical decision making by the Practitioner; and/or  . In rare instances, security protocols could fail, causing a breach of personal health information.  Furthermore, I acknowledge that it is my responsibility to provide information about my medical history, conditions and care that is complete and accurate to the best of my ability. I acknowledge that Practitioner's advice, recommendations, and/or decision may be based on factors not within their control,  such as incomplete or inaccurate data provided by me or distortions of diagnostic images or specimens that may result from electronic transmissions. I understand that the practice of medicine is not an exact science and that Practitioner makes no warranties or guarantees regarding treatment outcomes. I acknowledge that a copy of this consent can be made available to me via my patient portal ([facility] [place]), or I can request a printed copy by calling the office of [facility].    I understand that my insurance will be billed for this visit.   I have read or had this consent read to me. . I understand the contents of this consent, which adequately explains the benefits and risks of the Services being provided via telemedicine.  . I have been provided ample opportunity to ask questions regarding this consent and the Services and have had my questions answered to my satisfaction. . I give my informed consent for the services to be provided through the use of telemedicine in my medical care        Signed, Leanor Kail, [place]  05/27/2020 10:51 AM    [address], [place], [place]  [address] Phone: [phone]; Fax: [phone]

## 2020-05-27 NOTE — Patient Instructions (Signed)
Medication Instructions:  Your physician has recommended you make the following change in your medication:  1.  START Colchicine 0.6 mg taking 1 twice a day for 1 week then go down to 1 daily   *If you need a refill on your cardiac medications before your next appointment, please call your pharmacy*   Lab Work: None ordered  If you have labs (blood work) drawn today and your tests are completely normal, you will receive your results only by: Marland Kitchen MyChart Message (if you have MyChart) OR . A paper copy in the mail If you have any lab test that is abnormal or we need to change your treatment, we will call you to review the results.   Testing/Procedures: Your physician has requested that you have an limited echocardiogram. Echocardiography is a painless test that uses sound waves to create images of your heart. It provides your doctor with information about the size and shape of your heart and how well your heart's chambers and valves are working. This procedure takes approximately one hour. There are no restrictions for this procedure.    Follow-Up: At [facility], you and your health needs are our priority.  As part of our continuing mission to provide you with exceptional heart care, we have created designated Provider Care Teams.  These Care Teams include your primary Cardiologist (physician) and Advanced Practice Providers (APPs -  Physician Assistants and Nurse Practitioners) who all work together to provide you with the care you need, when you need it.  We recommend signing up for the patient portal called "MyChart".  Sign up information is provided on this After Visit Summary.  MyChart is used to connect with patients for Virtual Visits (Telemedicine).  Patients are able to view lab/test results, encounter notes, upcoming appointments, etc.  Non-urgent messages can be sent to your provider as well.   To learn more about what you can do with MyChart, go to [email or web address].     Your next appointment:   06/30/2020 8:45  The format for your next appointment:   Virtual Visit   Provider:   Robbie Lis, PA-C   Other Instructions  Echocardiogram An echocardiogram is a procedure that uses painless sound waves (ultrasound) to produce an image of the heart. Images from an echocardiogram can provide important information about:  Signs of coronary artery disease (CAD).  Aneurysm detection. An aneurysm is a weak or damaged part of an artery wall that bulges out from the normal force of blood pumping through the body.  Heart size and shape. Changes in the size or shape of the heart can be associated with certain conditions, including heart failure, aneurysm, and CAD.  Heart muscle function.  Heart valve function.  Signs of a past heart attack.  Fluid buildup around the heart.  Thickening of the heart muscle.  A tumor or infectious growth around the heart valves. Tell a health care provider about:  Any allergies you have.  All medicines you are taking, including vitamins, herbs, eye drops, creams, and over-the-counter medicines.  Any blood disorders you have.  Any surgeries you have had.  Any medical conditions you have.  Whether you are pregnant or may be pregnant. What are the risks? Generally, this is a safe procedure. However, problems may occur, including:  Allergic reaction to dye (contrast) that may be used during the procedure. What happens before the procedure? No specific preparation is needed. You may eat and drink normally. What happens during the procedure?   An IV  tube may be inserted into one of your veins.  You may receive contrast through this tube. A contrast is an injection that improves the quality of the pictures from your heart.  A gel will be applied to your chest.  A wand-like tool (transducer) will be moved over your chest. The gel will help to transmit the sound waves from the transducer.  The sound waves will  harmlessly bounce off of your heart to allow the heart images to be captured in real-time motion. The images will be recorded on a computer. The procedure may vary among health care providers and hospitals. What happens after the procedure?  You may return to your normal, everyday life, including diet, activities, and medicines, unless your health care provider tells you not to do that. Summary  An echocardiogram is a procedure that uses painless sound waves (ultrasound) to produce an image of the heart.  Images from an echocardiogram can provide important information about the size and shape of your heart, heart muscle function, heart valve function, and fluid buildup around your heart.  You do not need to do anything to prepare before this procedure. You may eat and drink normally.  After the echocardiogram is completed, you may return to your normal, everyday life, unless your health care provider tells you not to do that. This information is not intended to replace advice given to you by your health care provider. Make sure you discuss any questions you have with your health care provider. Document Revised: 01/17/2019 Document Reviewed: 10/29/2016 Elsevier Patient Education  [place] team (Cardiologist [Heart Doctor] and Journalist, newspaper Assistant; Nurse Practitioner]) has arranged for your next office appointment to be a virtual visit (also known as "Telehealth", "Telemedicine", "E-Visit").   We now offer virtual visits for all our patients.  This helps [place] to expand our ability to see patients in a timely and safe manner.  These visits are billed to your insurance just like traditional, in person, appointments.  Please review this IMPORTANT information about your upcoming appointment.   **PLEASE READ THE SECTION BELOW LABELED "CONSENT".**   **CALL OUR OFFICE WITH QUESTIONS.**   WHAT YOU NEED FOR YOUR VIRTUAL VISIT:  You will  need a SmartPhone with microphone and video capability.  [If you are using MyChart to connect to your visit, it is also possible to use a desktop/laptop computer (with an Internet connection), as long as you have microphone and video capability.]  You will need to use Chrome, [employer] or [employer] as your Wellsite geologist.  We highly recommend that you have a MyChart account as this will make connecting to your visit seamless.  A MyChart account not only allows you to connect to your provider for a virtual visit, but also allows you to see the results of all your tests, provider notes, medications and upcoming appointments.    A MyChart account is not absolutely necessary.  We can still complete your visit if you do not have one.    If you do not have a computer or SmartPhone with video/microphone capability or your Internet/cell service is weak on the day of your visit, we will do your visit by telephone.    A blood pressure cuff and scale are essential to collect your vital signs at home.  If you do not have these and you are unable to obtain them, please contact our office so that we can make arrangements for you.  If  you have a pulse oximeter, Apple watch, Kardia mobile device, etc, you can collect data from these devices as well to share with the provider for your visit.  These devices are not required for a virtual visit.    WHAT TO DO ON THE DAY OF YOUR APPOINTMENT: 30 minutes before your appointment:  Take your blood pressure, pulse or heart rate (if your blood pressure machine is able to collect it) and weight.  Write all these numbers down so you can give it to the nurse or medical assistant that calls.  Get all of the medications you currently take and put them where you will be sitting for the appointment.  The nurse/medical assistant will go over these with you when he/she calls.  15 minutes before your appointment:  You will receive a phone call from a nurse or medical assistant  from our office.  The caller ID on your phone may indicate that the caller is either "[facility]" or "[place]".  However, the number may come across as spam.  Please turn off any spam blocker so you do not miss the call.  The nurse will:  Ask for your blood pressure, pulse, weight, height.  Go over all of your medications to make sure your chart is correct.  Review your allergies, smoking history, reason for appointment, etc.   Give you instructions on how to connect with the video platform or telephone.  IF YOUR VISIT IS BY TELEPHONE ONLY: After the nurse finishes getting you ready for the visit, your provider will call you on the phone number you provide to [place].   TO CONNECT WITH YOUR PROVIDER FOR YOUR APPOINTMENT (BY VIDEO): You will either connect with the provider with your MyChart account or (if you are not using MyChart) with a link sent to your SmartPhone by text message.  If you are using MyChart, see below.  If you are not using MyChart, the nurse will send you the text message during the phone call.     If you are connecting with your MyChart account:   (The nurse that calls you will tell you when to do this):  You will log into your account. At the top of your home screen, you should see the following prompt that tells you to "Begin your video visit with . . . ".  Click the green button (BEGIN VISIT).    The next screen will say "It's time to start your video visit!" Click the green button (BEGIN VIDEO VISIT)    There may be a screen that appears that asks for permission to use your camera and/or microphone.  Click ALLOW.  This will open the browser where your appointment will take place.   You may see the message: "Welcome.  Waiting for the call to begin."  Or, you will see that the nurse or your provider are already "in" the room waiting on you.   If you are connecting with a link sent to your SmartPhone via text: The nurse that calls you will send the link  by text message. Click on the link (it should look something like this):     If asked to give permission to use the camera and or microphone, click ALLOW This will open the browser where your appointment will take place.      You may see the message: "Welcome.  Waiting for the call to begin."  Or, you will see that the nurse or your provider are already "in" the  room waiting on you.    The controls for your visit look like the picture below.  Please note that this is what the microphone and camera look like when they are ON.  If muted, they will have a line through them.    After the appointment: Once your provider leaves the appointment, he/she will go over instructions with the nurse/medical assistant.  If needed, this person will call you with any instructions, appointments, etc.   A copy of your After Visit Summary (AVS) will be available later that day in your MyChart account.  This document will have all of your instructions, medications, appointments, etc.  If you are not using MyChart, we will mail it to your home.     *CONSENT FOR TELE-HEALTH VISIT - PLEASE REVIEW* By participating in the scheduled virtual visit (and any virtual visit scheduled within 365 days of the printing of this document), I agree to the following:   I hereby voluntarily request, consent and authorize [facility] and its employed or contracted physicians, physician assistants, nurse practitioners or other licensed health care professionals (the Practitioner), to provide me with telemedicine health care services (the "Services") as deemed necessary by the treating Practitioner. I acknowledge and consent to receive the Services by the Practitioner via telemedicine. I understand that the telemedicine visit will involve communicating with the Practitioner through live audiovisual communication technology and the disclosure of certain medical information by electronic transmission. I acknowledge that I have been  given the opportunity to request an in-person assessment or other available alternative prior to the telemedicine visit and am voluntarily participating in the telemedicine visit.  I understand that I have the right to withhold or withdraw my consent to the use of telemedicine in the course of my care at any time, without affecting my right to future care or treatment, and that the Practitioner or I may terminate the telemedicine visit at any time. I understand that I have the right to inspect all information obtained and/or recorded in the course of the telemedicine visit and may receive copies of available information for a reasonable fee.  I understand that some of the potential risks of receiving the Services via telemedicine include:  Marland Kitchen Delay or interruption in medical evaluation due to technological equipment failure or disruption; . Information transmitted may not be sufficient (e.g. poor resolution of images) to allow for appropriate medical decision making by the Practitioner; and/or  . In rare instances, security protocols could fail, causing a breach of personal health information.  Furthermore, I acknowledge that it is my responsibility to provide information about my medical history, conditions and care that is complete and accurate to the best of my ability. I acknowledge that Practitioner's advice, recommendations, and/or decision may be based on factors not within their control, such as incomplete or inaccurate data provided by me or distortions of diagnostic images or specimens that may result from electronic transmissions. I understand that the practice of medicine is not an exact science and that Practitioner makes no warranties or guarantees regarding treatment outcomes. I acknowledge that a copy of this consent can be made available to me via my patient portal ([place]), or I can request a printed copy by calling the office of [place].    I understand that my  insurance will be billed for this visit.   I have read or had this consent read to me. . I understand the contents of this consent, which adequately explains the benefits and  risks of the Services being provided via telemedicine.  . I have been provided ample opportunity to ask questions regarding this consent and the Services and have had my questions answered to my satisfaction. . I give my informed consent for the services to be provided through the use of telemedicine in my medical care

## 2020-05-27 NOTE — Telephone Encounter (Signed)
  Patient Consent for Virtual Visit         [name] has provided verbal consent on 05/27/2020 for a virtual visit (video or telephone).   CONSENT FOR VIRTUAL VISIT FOR:  [name]  By participating in this virtual visit I agree to the following:  I hereby voluntarily request, consent and authorize [place] and its employed or contracted physicians, physician assistants, nurse practitioners or other licensed health care professionals (the Practitioner), to provide me with telemedicine health care services (the "Services") as deemed necessary by the treating Practitioner. I acknowledge and consent to receive the Services by the Practitioner via telemedicine. I understand that the telemedicine visit will involve communicating with the Practitioner through live audiovisual communication technology and the disclosure of certain medical information by electronic transmission. I acknowledge that I have been given the opportunity to request an in-person assessment or other available alternative prior to the telemedicine visit and am voluntarily participating in the telemedicine visit.  I understand that I have the right to withhold or withdraw my consent to the use of telemedicine in the course of my care at any time, without affecting my right to future care or treatment, and that the Practitioner or I may terminate the telemedicine visit at any time. I understand that I have the right to inspect all information obtained and/or recorded in the course of the telemedicine visit and may receive copies of available information for a reasonable fee.  I understand that some of the potential risks of receiving the Services via telemedicine include:  Marland Kitchen Delay or interruption in medical evaluation due to technological equipment failure or disruption; . Information transmitted may not be sufficient (e.g. poor resolution of images) to allow for appropriate medical decision making by the Practitioner;  and/or  . In rare instances, security protocols could fail, causing a breach of personal health information.  Furthermore, I acknowledge that it is my responsibility to provide information about my medical history, conditions and care that is complete and accurate to the best of my ability. I acknowledge that Practitioner's advice, recommendations, and/or decision may be based on factors not within their control, such as incomplete or inaccurate data provided by me or distortions of diagnostic images or specimens that may result from electronic transmissions. I understand that the practice of medicine is not an exact science and that Practitioner makes no warranties or guarantees regarding treatment outcomes. I acknowledge that a copy of this consent can be made available to me via my patient portal ([place]), or I can request a printed copy by calling the office of [place].    I understand that my insurance will be billed for this visit.   I have read or had this consent read to me. . I understand the contents of this consent, which adequately explains the benefits and risks of the Services being provided via telemedicine.  . I have been provided ample opportunity to ask questions regarding this consent and the Services and have had my questions answered to my satisfaction. . I give my informed consent for the services to be provided through the use of telemedicine in my medical care

## 2020-05-29 ENCOUNTER — Telehealth: Payer: Self-pay | Admitting: Physician Assistant

## 2020-05-29 NOTE — Telephone Encounter (Signed)
Returned call to [employer] to let them know it is ok for pt to have capsules since her insurance will not cover tablets.

## 2020-05-29 NOTE — Telephone Encounter (Signed)
     Pt c/o medication issue:  1. Name of Medication:   colchicine 0.6 MG tablet     2. How are you currently taking this medication (dosage and times per day)? Take 1 tablet by mouth twice a day for 1 week then go down tot 1 tablet by mouth daily  3. Are you having a reaction (difficulty breathing--STAT)?   4. What is your medication issue? [place] called, she said pt's insurance wont pay for the tablet form and if its ok if pt take the capsule form for colchicine.

## 2020-06-25 NOTE — Progress Notes (Signed)
Virtual Visit via Telephone Note   This visit type was conducted due to national recommendations for restrictions regarding the COVID-19 Pandemic (e.g. social distancing) in an effort to limit this patient's exposure and mitigate transmission in our community.  Due to her co-morbid illnesses, this patient is at least at moderate risk for complications without adequate follow up.  This format is felt to be most appropriate for this patient at this time.  The patient did not have access to video technology/had technical difficulties with video requiring transitioning to audio format only (telephone).  All issues noted in this document were discussed and addressed.  No physical exam could be performed with this format.  Please refer to the patient's chart for her  consent to telehealth for [name].    Date:  06/25/2020   ID:  [name], DOB [birth year], MRN [id number] The patient was identified using 2 identifiers.  Patient Location: Home Provider Location: Home Office  PCP:  Sara Carol, MD  Cardiologist:  Sara Grooms, MD Electrophysiologist:  None   Evaluation Performed:  Follow-Up Visit  Chief Complaint: Chest pain suspicious for pericarditis  History of Present Illness:    [name] is a 59 y.o. female with a hx of Sjogren's syndromesince 2009 ([name] rheumatology [facility] [place]), cardiomegaly and recurrent chest pain who is being seen today for follow up.   He was seen by [name] 11/2019 for chest pain at which time symptoms were felt to be secondary to pericarditis related to underlying chronic inflammatory disease that improved with naproxen. Follow up echocardiogram 12/2019 with normal LVEF and G2DD with small pericardial effusion consistent with pericarditis. Plan was for the patient to discuss with her rheumatologist.   She was most recently seen 05/27/20 for follow up and continued to have persistent chest pain. She was  admitted to [name] in 04/2020 due to on going symptoms and ruled out for MI. She had a normal stress test and troponin was normal. Echocardiogram during that time showed resolved pericardial effusion. She reportedly exercised regularly which did not envoke symptoms. She was taking 800mg  Ibuprofen and tramadol at night.   Plan was to trail colchicine with follow up with rheumatologist as well.   Today [name] reports that she has had no change in her symptoms.  She describes her chest pain as centralized, nonradiating fairly constant dull, chest pain.  She reports no exacerbation with exercise.  She states bending over, coughing and sneezing increase her pain.  She states she is seeing an internal medicine doctor on Friday and hopes to be referred to a rheumatologist.  I have suggested that they obtain inflammatory markers at that time.  She agrees.  I do not see her follow-up echocardiogram or stress test in care everywhere therefore she will fax this information over to our office.  She did read the report off of her phone to me.  She states she previously tried ibuprofen for several days with some symptom resolution however was nervous to continue the higher dosing therefore she stopped.  We discussed continuation of ibuprofen therapy for at least 1 to 2 weeks with close follow-up.  Symptoms do not seem consistent with angina.   The patient does not have symptoms concerning for COVID-19 infection (fever, chills, cough, or new shortness of breath).    Past Medical History:  Diagnosis Date  . Colon cancer ([place])    finished chemo feb 2009  . Endocarditis   .  Enlarged heart   . Helicobacter pylori gastritis 12/23/08  . Hyperplastic colon polyp 12/23/08  . Iron deficiency anemia   . Miscarriage   . Sjoegren syndrome    Past Surgical History:  Procedure Laterality Date  . BREAST REDUCTION SURGERY    . DILATION AND CURETTAGE OF UTERUS     x 7  . HEMICOLECTOMY     2008  . REDUCTION  MAMMAPLASTY Bilateral 1993     No outpatient medications have been marked as taking for the 07/01/20 encounter (Appointment) with Sara Raymond, NP.     Allergies:   Venofer [ferric oxide], Dilaudid [hydromorphone hcl], and Lorazepam   Social History   Tobacco Use  . Smoking status: Never Smoker  . Smokeless tobacco: Never Used  Substance Use Topics  . Alcohol use: No  . Drug use: No     Family Hx: The patient's family history includes Anemia in her mother; Diabetes in her mother; Kidney disease in her mother. There is no history of Colon cancer.  ROS:   Please see the history of present illness.     All other systems reviewed and are negative.  Prior CV studies:   The following studies were reviewed today:  Echocardiogram: 12/2019 1. Small pericardial effusion, best seen anteriorly but appears  circumerential. No septal shudder or respirophasic septal shift. No  definite findings to suggest constrictive physiology, though study was not  performed with respirometer for full  assessment.  2. Left ventricular ejection fraction, by estimation, is 60 to 65%. The  left ventricle has normal function. The left ventricle has no regional  wall motion abnormalities. There is mild asymmetric left ventricular  hypertrophy of the basal-septal segment.  Left ventricular diastolic parameters are consistent with Grade II  diastolic dysfunction (pseudonormalization). Elevated left ventricular  end-diastolic pressure.  3. Right ventricular systolic function is normal. The right ventricular  size is normal. There is normal pulmonary artery systolic pressure. The  estimated right ventricular systolic pressure is 60.6 mmHg.  4. The mitral valve is normal in structure. Mild mitral valve  regurgitation. No evidence of mitral stenosis.  5. The aortic valve is tricuspid. Aortic valve regurgitation is not  visualized. No aortic stenosis is present.  6. The inferior vena cava is normal  in size with greater than 50%  respiratory variability, suggesting right atrial pressure of 3 mmHg.   Labs/Other Tests and Data Reviewed:    EKG:  No ECG reviewed.  Recent Labs: 11/13/2019: BUN 13; Creatinine, Ser 0.99; Hemoglobin 13.9; Platelets 286; Potassium 3.8; Sodium 141   Recent Lipid Panel No results found for: CHOL, TRIG, HDL, CHOLHDL, LDLCALC, LDLDIRECT  Wt Readings from Last 3 Encounters:  05/27/20 209 lb 12.8 oz (95.2 kg)  12/05/19 210 lb (95.3 kg)  11/13/19 204 lb (92.5 kg)     Objective:    Vital Signs:  There were no vitals taken for this visit.   VITAL SIGNS:  reviewed GEN:  no acute distress RESPIRATORY:  normal respiratory effort, symmetric expansion NEURO:  alert and oriented x 3, no obvious focal deficit PSYCH:  normal affect  ASSESSMENT & PLAN:    1. Chest discomfort/pericarditis with small pericardial effusion: -Diagnosed with pericarditis due to persistent chest pain with underlying chronic inflammatory disease 11/2019 -Repeat echocardiogram with resolution of pericardial effusion>> per patient report.  I am having her fax this document over to our office for full review. -Recent stress test found to be normal 04/2020 performed for ongoing chest discomfort with  negative troponins>> also per patient report with plans to fax for full review -Reports no change in symptoms with colchicine.  She only briefly took higher dose ibuprofen for several days with some symptom resolution therefore we will trial a 2-week period of 600 mg ibuprofen p.o. 3 times daily with close follow-up. -Needs inflammatory markers including sed rate, CRP however patient reports that she sees internal medicine on Friday and will request these labs -Informed to monitor for bleeding in stool or urine -Offered prescription for Pepcid versus Protonix however patient prefers over-the-counter for GI protection -Continue colchicine -Low suspicion for coronary etiology given no exertional  symptoms, negative stress test, stable echocardiogram and negative troponin levels  COVID-19 Education: The signs and symptoms of COVID-19 were discussed with the patient and how to seek care for testing (follow up with PCP or arrange E-visit). The importance of social distancing was discussed today.  Time:   Today, I have spent 20 minutes with the patient with telehealth technology discussing the above problems.     Medication Adjustments/Labs and Tests Ordered: Current medicines are reviewed at length with the patient today.  Concerns regarding medicines are outlined above.   Tests Ordered: No orders of the defined types were placed in this encounter.   Medication Changes: No orders of the defined types were placed in this encounter.   Follow Up:  Virtual Visit  Myself in 2 weeks  Signed, Kathyrn Drown, NP  06/25/2020 3:13 PM    [facility]

## 2020-06-30 ENCOUNTER — Telehealth: Payer: 59 | Admitting: Physician Assistant

## 2020-07-01 ENCOUNTER — Telehealth (INDEPENDENT_AMBULATORY_CARE_PROVIDER_SITE_OTHER): Payer: Medicare Other | Admitting: Cardiology

## 2020-07-01 ENCOUNTER — Encounter: Payer: Self-pay | Admitting: Cardiology

## 2020-07-01 ENCOUNTER — Other Ambulatory Visit: Payer: Self-pay

## 2020-07-01 VITALS — BP 124/83 | HR 68 | Ht 69.0 in | Wt 209.0 lb

## 2020-07-01 DIAGNOSIS — R072 Precordial pain: Secondary | ICD-10-CM | POA: Diagnosis not present

## 2020-07-01 DIAGNOSIS — R079 Chest pain, unspecified: Secondary | ICD-10-CM | POA: Diagnosis not present

## 2020-07-01 DIAGNOSIS — I318 Other specified diseases of pericardium: Secondary | ICD-10-CM

## 2020-07-01 NOTE — Patient Instructions (Signed)
Medication Instructions: -- TAKE IBUPROFEN 600 MG THREE TIMES DAILY FOR 2 WEEKS --   *If you need a refill on your cardiac medications before your next appointment, please call your pharmacy*  Follow-Up: At [facility], you and your health needs are our priority.  As part of our continuing mission to provide you with exceptional heart care, we have created designated Provider Care Teams.  These Care Teams include your primary Cardiologist (physician) and Advanced Practice Providers (APPs -  Physician Assistants and Nurse Practitioners) who all work together to provide you with the care you need, when you need it.  We recommend signing up for the patient portal called "MyChart".  Sign up information is provided on this After Visit Summary.  MyChart is used to connect with patients for Virtual Visits (Telemedicine).  Patients are able to view lab/test results, encounter notes, upcoming appointments, etc.  Non-urgent messages can be sent to your provider as well.   To learn more about what you can do with MyChart, go to [email or web address].    Your next appointment:   Your physician recommends that you schedule a follow-up appointment in: 2-4 WEEKS -- 07/29/20 at 8:45 am  The format for your next appointment:   Virtual Visit  with Kathyrn Drown, NP

## 2020-07-06 ENCOUNTER — Ambulatory Visit: Payer: 59 | Admitting: Interventional Cardiology

## 2020-07-21 NOTE — Progress Notes (Deleted)
{Choose 1 Note Type (Video or Telephone):[phone]}    Date:  07/21/2020   ID:  [name], DOB 12/18/1960, MRN [id number] The patient was identified using 2 identifiers.  {Patient Location:[phone]::"Home"} {Provider Location:[phone]::"Home Office"}  PCP:  Seward Carol, MD  Cardiologist:  Sinclair Grooms, MD  Electrophysiologist:  None   Evaluation Performed:  {Choose Visit VCBS:[id number]::"GYKZLD-JT Visit"}  Chief Complaint:  ***  History of Present Illness:    [name] is a 59 y.o. female with a hx of Sjogren's syndromesince 2009 (followed by Dr. Christene Lye [facility] rheumatology [place], [place]), cardiomegaly and recurrent chest pain who is being seen today for 2 week  follow up.   [name] was seen by Dr. Tamala Julian 11/2019 for chest pain at which time symptoms were felt to be secondary to pericarditis related to underlying chronic inflammatory disease that improved with naproxen. Follow up echocardiogram 12/2019 showed normal LVEF and G2DD with small pericardial effusion consistent with pericarditis. Plan was for continued treatment and further discussion with her rheumatologist.   She was then seen 05/27/20 for follow up and continued to have persistent chest pain. She was admitted to [facility] in 04/2020 due to on going symptoms and ruled out for MI. She had a/normal stress test and troponin was normal. Echocardiogram during that time showed resolved pericardial effusion. She reportedly exercised regularly which did not envoke symptoms. She was taking 800mg  Ibuprofen and tramadol at night.   Plan was to trail colchicine with follow up with rheumatologist as well.   I then saw her in follow up 07/01/20 at which time she had no change in her symptoms. Interestingly, symptoms were worsened by bending over, coughing and sneezing. She was to see her PCP the week of our visit in hopes to be referred to a rheumatologist. Echocardiogram or stress test was not  present in care everywhere therefore she was to have that faxed to our office.  She did report  previously trying ibuprofen for several days with some symptom resolution however was nervous to continue the higher dosing therefore she stopped. At that time, we discussed continuation of ibuprofen therapy for at least 1 to 2 weeks with close follow-up.  Symptoms do not seem consistent with angina.   Today she reports    1. Chest discomfort/pericarditis with small pericardial effusion: -Diagnosed with pericarditis due to persistent chest pain with underlying chronic inflammatory disease 11/2019 -Repeat echocardiogram with resolution of pericardial effusion>> per patient report. Stress test was faxed however I don not see the echo results in  [facility]. -Recent stress test found to be normal 04/2020, personally review report. -Reports no change in symptoms with colchicine.  She only briefly took higher dose ibuprofen for several days with some symptom resolution therefore we will trial a 2-week period of 600 mg ibuprofen p.o. 3 times daily with close follow-up. -Needs inflammatory markers including sed rate, CRP however patient reports that she sees internal medicine on Friday and will request these labs -Informed to monitor for bleeding in stool or urine -Offered prescription for Pepcid versus Protonix however patient prefers over-the-counter for GI protection -Continue colchicine -Low suspicion for coronary etiology given no exertional symptoms, negative stress test, stable echocardiogram and negative troponin levels  The patient {does/does not:200015} have symptoms concerning for COVID-19 infection (fever, chills, cough, or new shortness of breath).    Past Medical History:  Diagnosis Date   Colon cancer [facility])    finished chemo feb 2009   Endocarditis  Enlarged heart    Helicobacter pylori gastritis 12/23/08   Hyperplastic colon polyp 12/23/08   Iron deficiency anemia    Miscarriage     Sjoegren syndrome    Past Surgical History:  Procedure Laterality Date   BREAST REDUCTION SURGERY     DILATION AND CURETTAGE OF UTERUS     x 7   HEMICOLECTOMY     2008   REDUCTION MAMMAPLASTY Bilateral 1993     No outpatient medications have been marked as taking for the 07/29/20 encounter (Appointment) with Tommie Raymond, NP.     Allergies:   Venofer [ferric oxide], Dilaudid [hydromorphone hcl], and Lorazepam   Social History   Tobacco Use   Smoking status: Never Smoker   Smokeless tobacco: Never Used  Vaping Use   Vaping Use: Never used  Substance Use Topics   Alcohol use: No   Drug use: No     Family Hx: The patient's family history includes Anemia in her mother; Diabetes in her mother; Kidney disease in her mother. There is no history of Colon cancer.  ROS:   Please see the history of present illness.    *** All other systems reviewed and are negative.   Prior CV studies:   The following studies were reviewed today:   Echocardiogram:12/2019 1. Small pericardial effusion, best seen anteriorly but appears  circumerential. No septal shudder or respirophasic septal shift. No  definite findings to suggest constrictive physiology, though study was not  performed with respirometer for full  assessment.  2. Left ventricular ejection fraction, by estimation, is 60 to 65%. The  left ventricle has normal function. The left ventricle has no regional  wall motion abnormalities. There is mild asymmetric left ventricular  hypertrophy of the basal-septal segment.  Left ventricular diastolic parameters are consistent with Grade II  diastolic dysfunction (pseudonormalization). Elevated left ventricular  end-diastolic pressure.  3. Right ventricular systolic function is normal. The right ventricular  size is normal. There is normal pulmonary artery systolic pressure. The  estimated right ventricular systolic pressure is 76.2 mmHg.  4. The mitral valve is  normal in structure. Mild mitral valve  regurgitation. No evidence of mitral stenosis.  5. The aortic valve is tricuspid. Aortic valve regurgitation is not  visualized. No aortic stenosis is present.  6. The inferior vena cava is normal in size with greater than 50%  respiratory variability, suggesting right atrial pressure of 3 mmHg.    Labs/Other Tests and Data Reviewed:    EKG:  {EKG/Telemetry Strips Reviewed:[phone]}  Recent Labs: 11/13/2019: BUN 13; Creatinine, Ser 0.99; Hemoglobin 13.9; Platelets 286; Potassium 3.8; Sodium 141   Recent Lipid Panel No results found for: CHOL, TRIG, HDL, CHOLHDL, LDLCALC, LDLDIRECT  Wt Readings from Last 3 Encounters:  07/01/20 209 lb (94.8 kg)  05/27/20 209 lb 12.8 oz (95.2 kg)  12/05/19 210 lb (95.3 kg)     Risk Assessment/Calculations:   {Does this patient have ATRIAL FIBRILLATION?[phone]}  Objective:    Vital Signs:  There were no vitals taken for this visit.   {HeartCare Virtual Exam (Optional):[phone]::"VITAL SIGNS:  reviewed"}  ASSESSMENT & PLAN:    1. ***  COVID-19 Education: The signs and symptoms of COVID-19 were discussed with the patient and how to seek care for testing (follow up with PCP or arrange E-visit).  ***The importance of social distancing was discussed today.  Time:   Today, I have spent *** minutes with the patient with telehealth technology discussing the above problems.  Medication Adjustments/Labs and Tests Ordered: Current medicines are reviewed at length with the patient today.  Concerns regarding medicines are outlined above.   Tests Ordered: No orders of the defined types were placed in this encounter.   Medication Changes: No orders of the defined types were placed in this encounter.   Follow Up:  {F/U Format:[phone]} {follow up:15908}  Signed, Kathyrn Drown, NP  07/21/2020 8:15 AM    [facility]

## 2020-07-29 ENCOUNTER — Other Ambulatory Visit: Payer: Self-pay

## 2020-07-29 ENCOUNTER — Telehealth: Payer: Medicare Other | Admitting: Cardiology

## 2021-03-19 IMAGING — US US ABDOMEN LIMITED
1 series · 13 of 25 positions shown · non-contrast
Comparison: December 24, 2012

CLINICAL DATA: Upper abdominal pain.  History of colon carcinoma

EXAM:
ULTRASOUND ABDOMEN LIMITED RIGHT UPPER QUADRANT

[Series 1: us abdomen limited · 0.19mm/px · 13 of 45 slices shown]
[im 1/45]
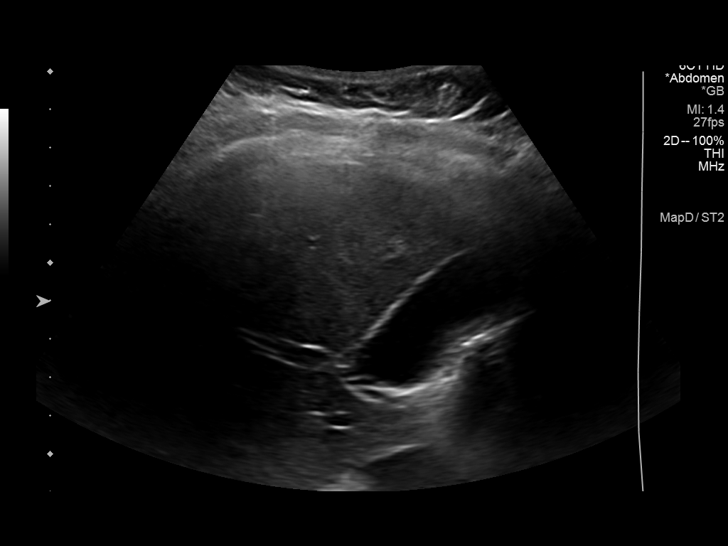
[im 4/45]
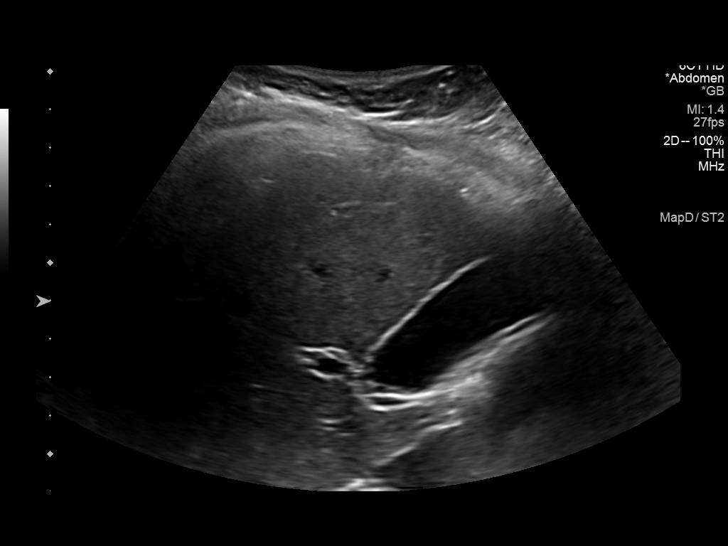
[im 8/45]
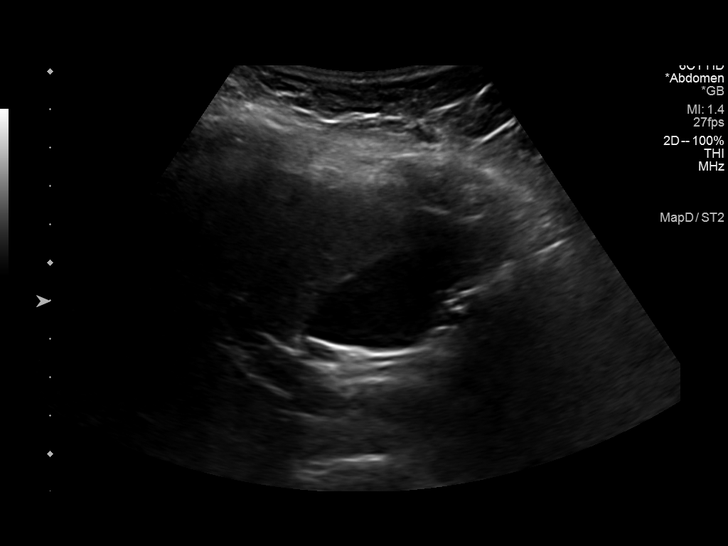
[im 12/45]
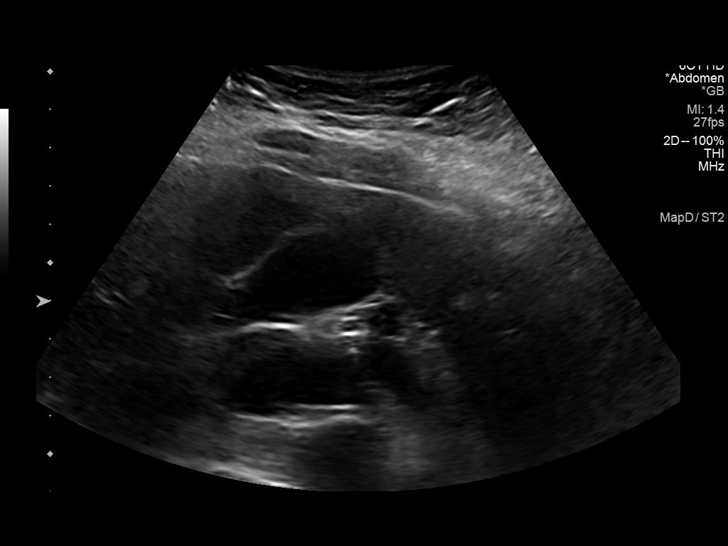
[im 15/45]
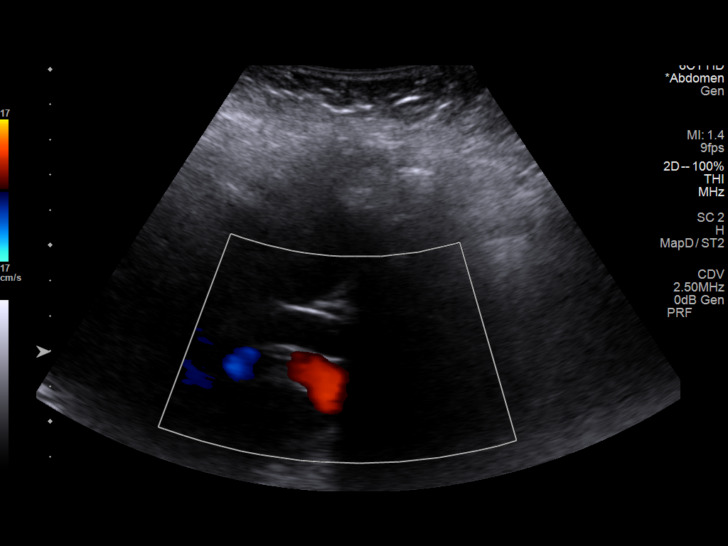
[im 19/45]
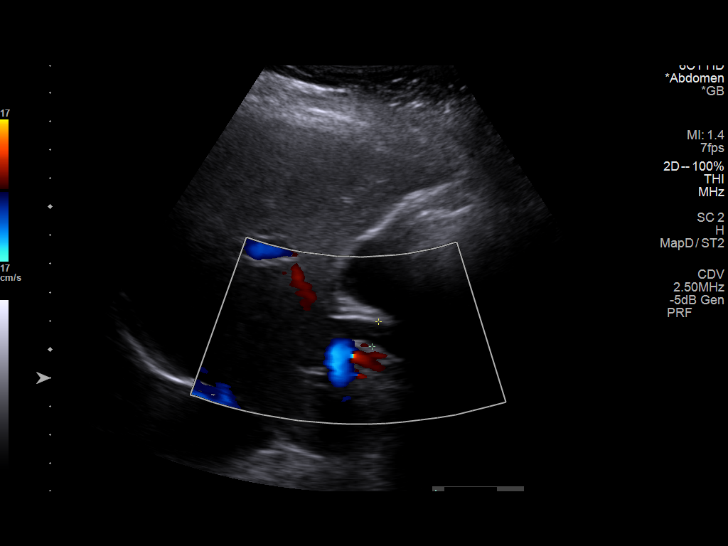
[im 23/45]
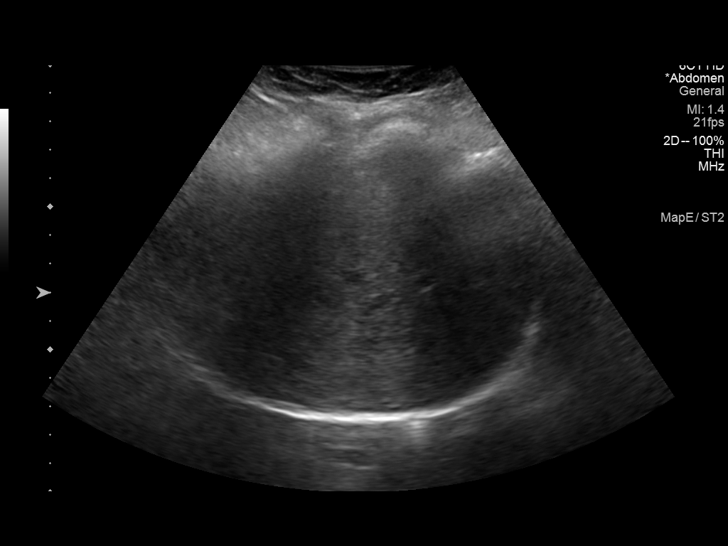
[im 26/45]
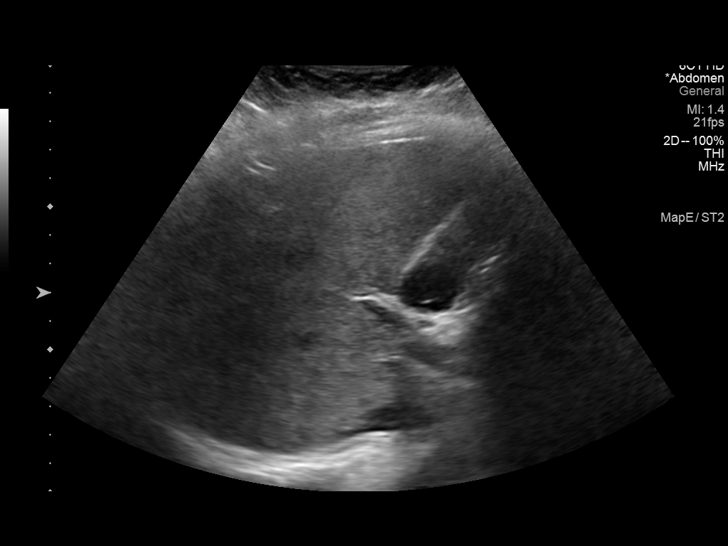
[im 30/45]
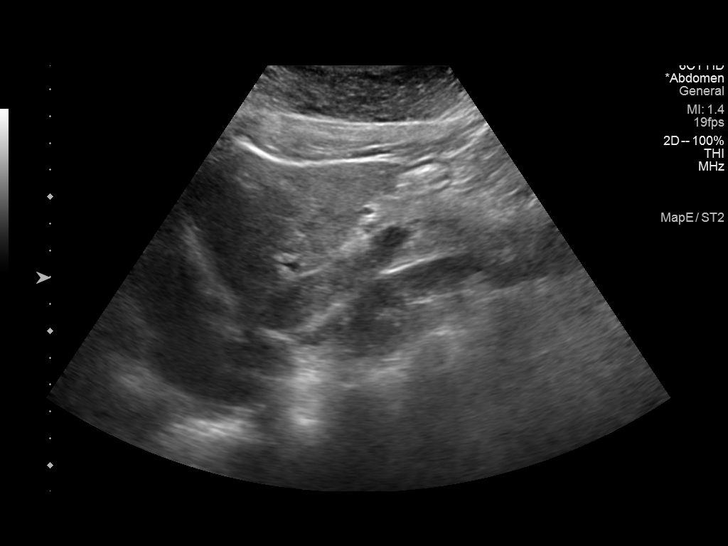
[im 34/45]
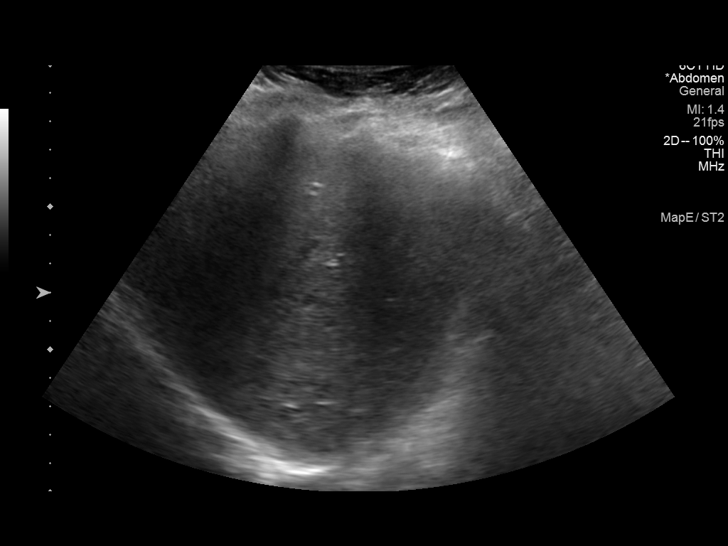
[im 37/45]
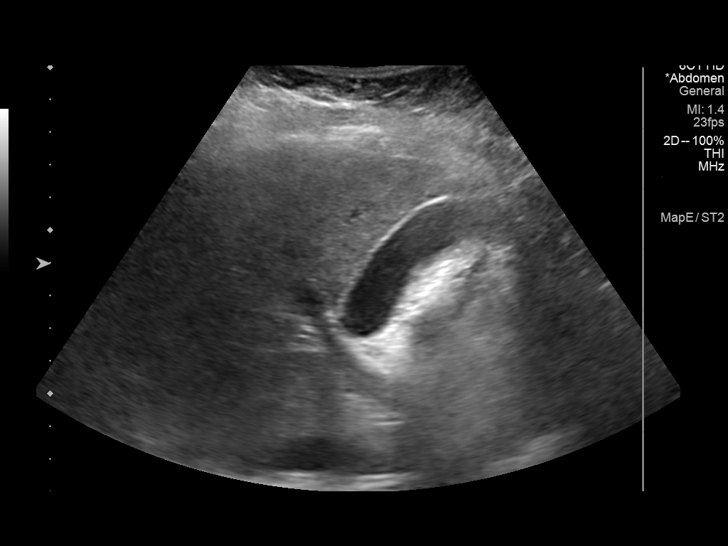
[im 41/45]
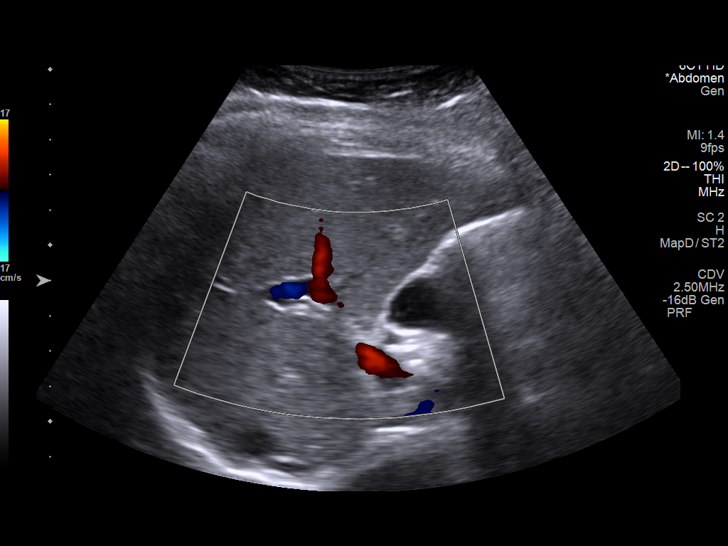
[im 45/45]
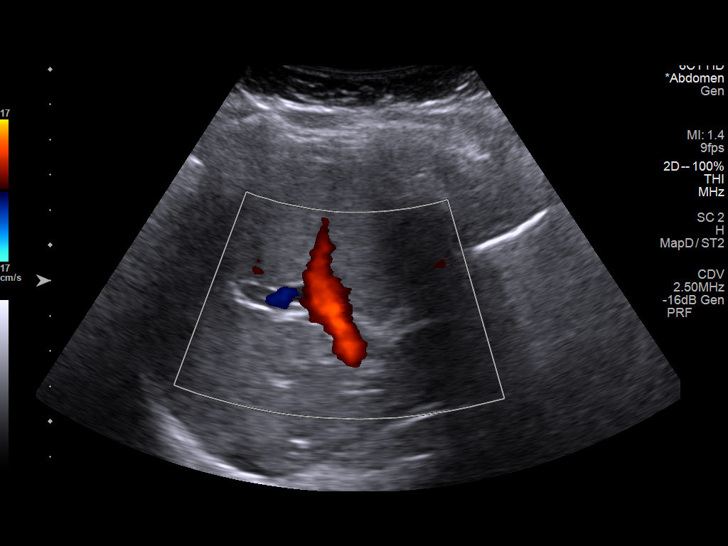

[13 of 25 positions shown; findings below may reference images not displayed]

FINDINGS: Gallbladder:

No gallstones or wall thickening visualized. There is no
pericholecystic fluid. No sonographic Murphy sign noted by
sonographer.

Common bile duct:

Diameter: Common hepatic duct and proximal common bile duct dilated
at between 9 and 10 mm. No intrahepatic biliary duct dilatation
evident. Note that the distal common bile duct is obscured by gas.

Liver:

No focal lesion identified. Liver echogenicity is increased
diffusely. Portal vein is patent on color Doppler imaging with
normal direction of blood flow towards the liver.

Other: None.
IMPRESSION: 1. Proximal common bile duct dilatation without focal mass or
calculus evident. Note that portions of the more distal common bile
duct are not appreciable due to overlying gas. This finding may
warrant further assessment; MRCP would be the imaging study of
choice to visualize the biliary ductal system.

2. Diffuse increase in liver echogenicity, a finding indicative of
hepatic steatosis. While no focal liver lesions are evident on this
study, it must be cautioned that the sensitivity of ultrasound for
detection of focal liver lesions is diminished in this circumstance.

3.  No demonstrable gallbladder pathology.

These results will be called to the ordering clinician or
representative by the Radiologist Assistant, and communication
documented in the PACS or zVision Dashboard.

## 2021-04-22 IMAGING — MG DIGITAL SCREENING BILAT W/ TOMO W/ CAD
6 of 12 series · 6 of 36 positions shown · non-contrast
Comparison: Previous exam(s).

CLINICAL DATA: Screening.

EXAM:
DIGITAL SCREENING BILATERAL MAMMOGRAM WITH TOMO AND CAD

[R MLO synth-2D (1 of 2)]
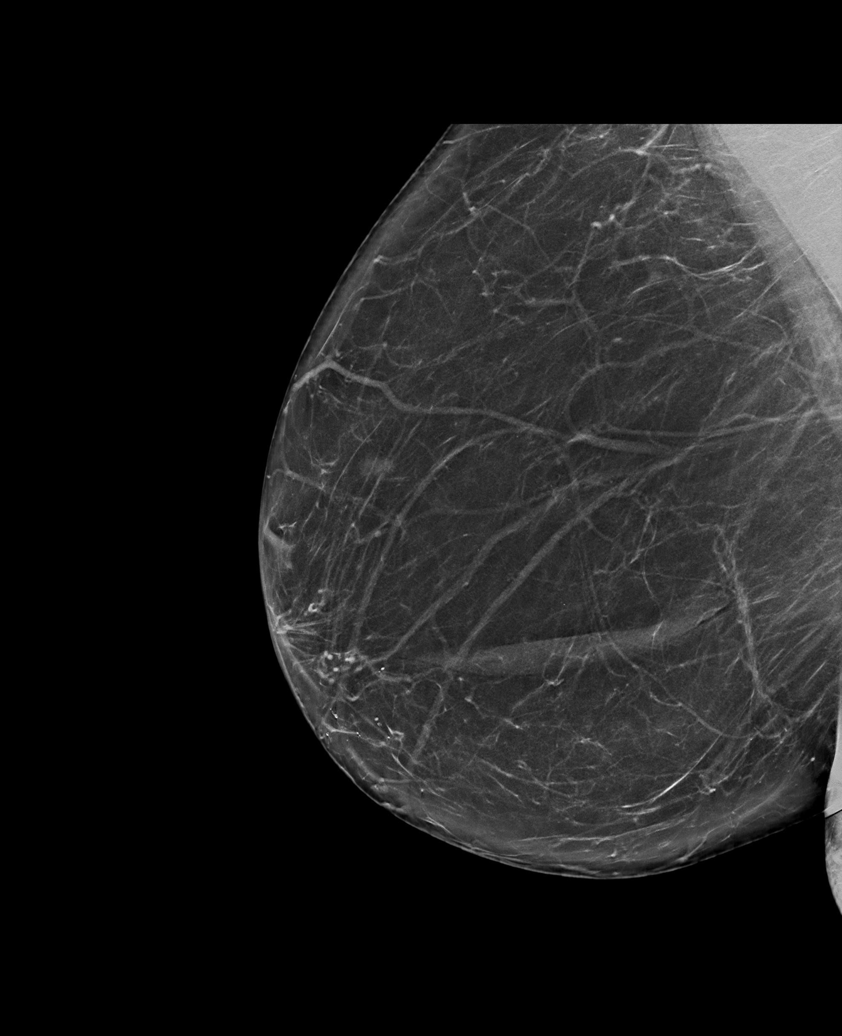

[R CC synth-2D (1 of 2)]
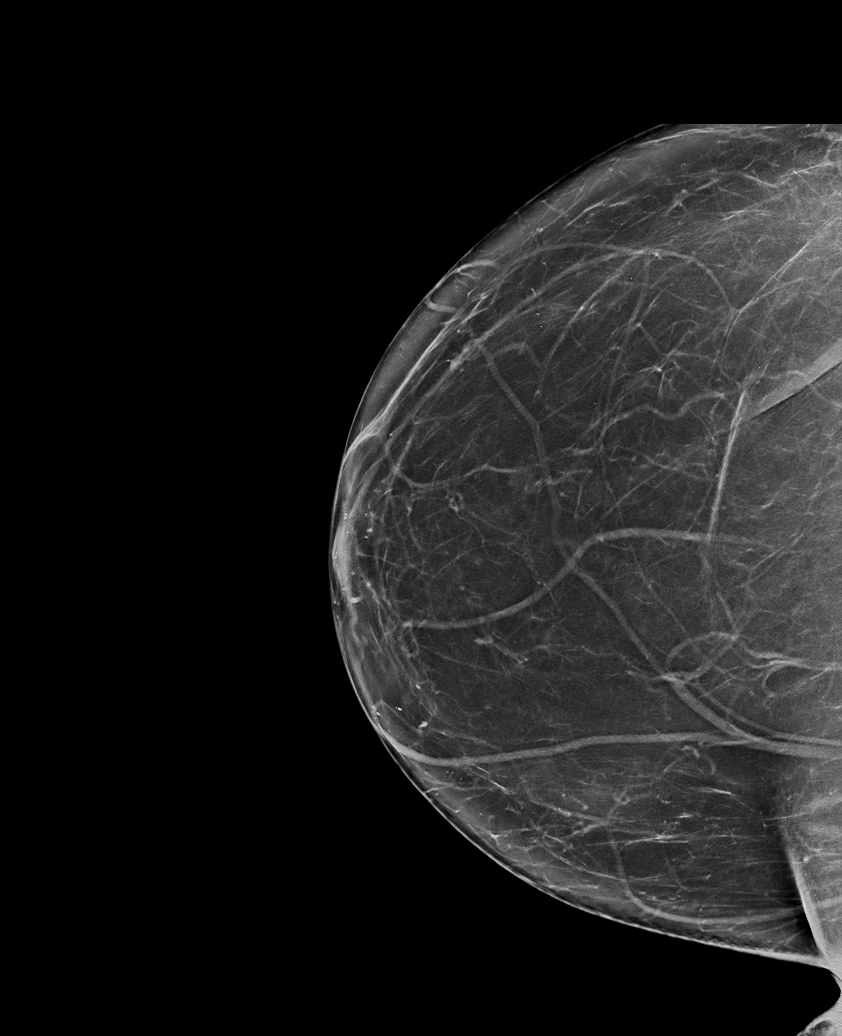

[L MLO synth-2D]
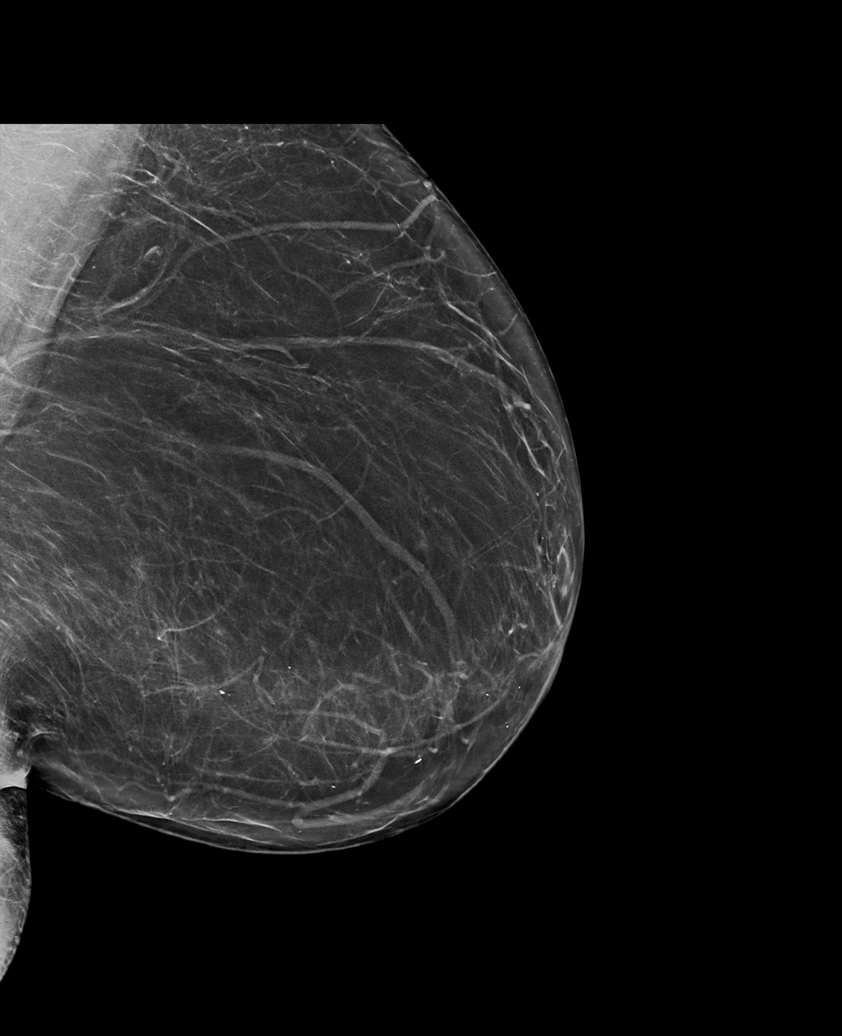

[L CC synth-2D]
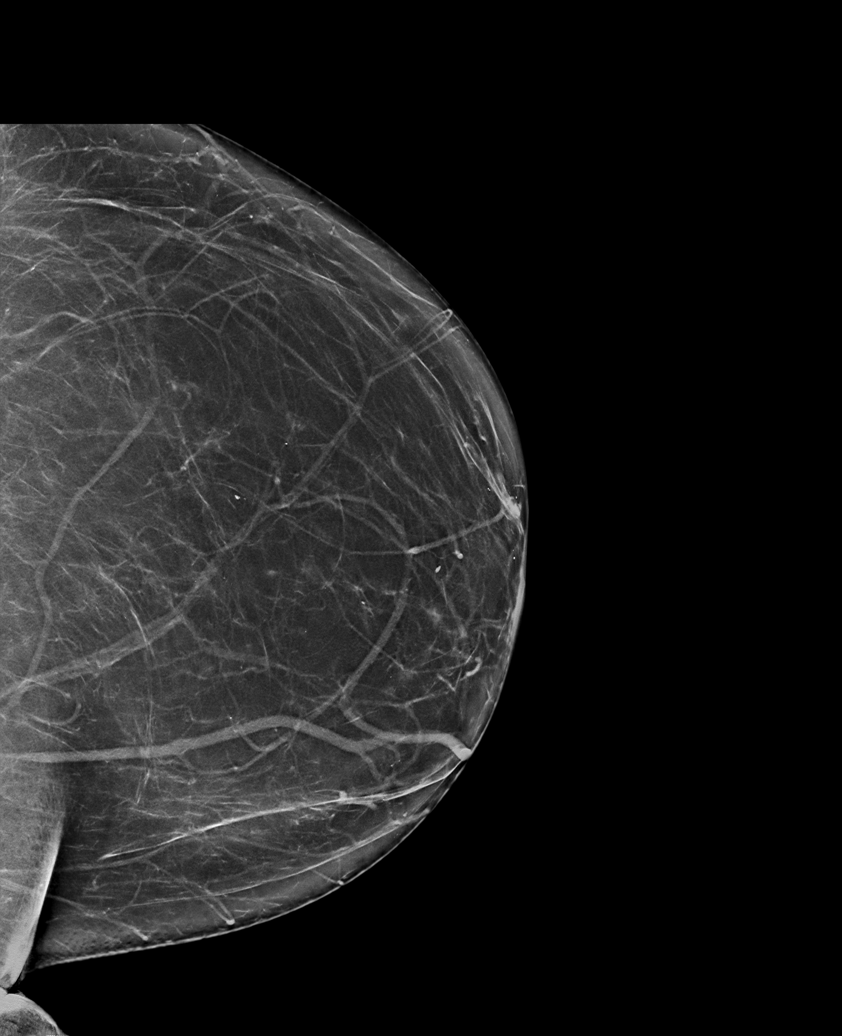

[R CC synth-2D (2 of 2)]
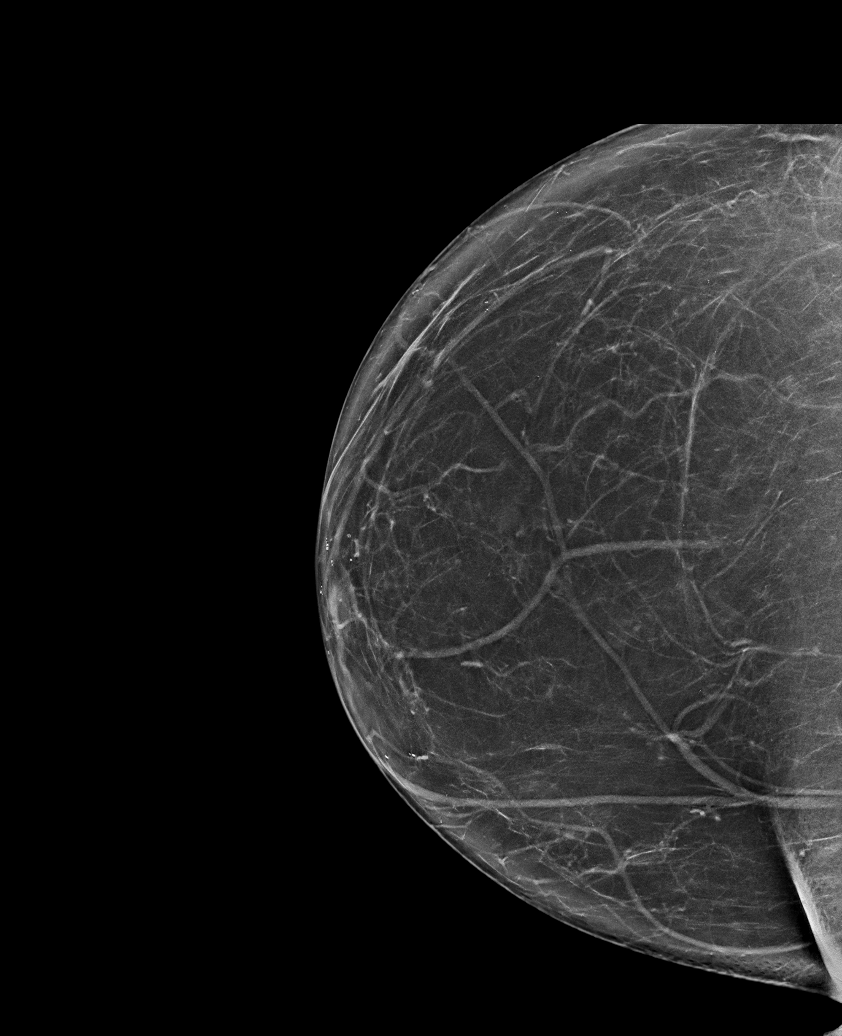

[R MLO synth-2D (2 of 2)]
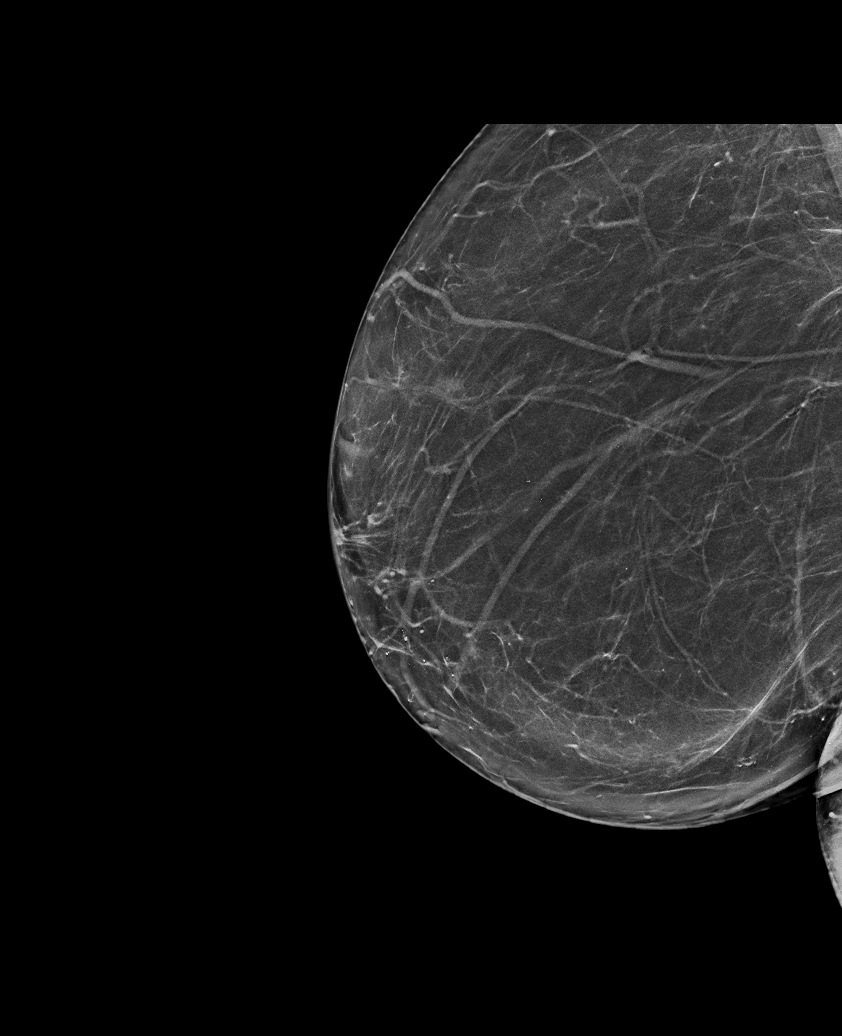

[6 of 36 positions shown; findings below may reference images not displayed]

ACR Breast Density Category b: There are scattered areas of
fibroglandular density.
FINDINGS: There are no findings suspicious for malignancy. Images were
processed with CAD.
IMPRESSION: No mammographic evidence of malignancy. A result letter of this
screening mammogram will be mailed directly to the patient.

RECOMMENDATION:
Screening mammogram in one year. (Code:[id number])

BI-RADS CATEGORY  1: Negative.
